# Patient Record
Sex: Male | Born: 1995 | Race: Black or African American | Hispanic: No | Marital: Single | Smoking: Never smoker
Health system: Southern US, Community
[De-identification: ages and names within clinical notes are randomized; demographics above are authoritative.]

## PROBLEM LIST (undated history)

## (undated) DIAGNOSIS — F840 Autistic disorder: Secondary | ICD-10-CM

## (undated) DIAGNOSIS — R569 Unspecified convulsions: Secondary | ICD-10-CM

---
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
## 2018-01-29 ENCOUNTER — Other Ambulatory Visit: Payer: Self-pay

## 2018-01-29 ENCOUNTER — Encounter (HOSPITAL_COMMUNITY): Payer: Self-pay

## 2018-01-29 ENCOUNTER — Emergency Department (HOSPITAL_COMMUNITY)
Admission: EM | Admit: 2018-01-29 | Discharge: 2018-01-29 | Disposition: A | Discharge status: Home / Self Care | Payer: Medicaid Other | Attending: Emergency Medicine | Admitting: Emergency Medicine

## 2018-01-29 DIAGNOSIS — F840 Autistic disorder: Secondary | ICD-10-CM | POA: Insufficient documentation

## 2018-01-29 DIAGNOSIS — Z711 Person with feared health complaint in whom no diagnosis is made: Secondary | ICD-10-CM | POA: Insufficient documentation

## 2018-01-29 DIAGNOSIS — Z041 Encounter for examination and observation following transport accident: Secondary | ICD-10-CM | POA: Diagnosis present

## 2018-01-29 DIAGNOSIS — Z79899 Other long term (current) drug therapy: Secondary | ICD-10-CM | POA: Diagnosis not present

## 2018-01-29 NOTE — ED Notes (Signed)
Pt verbalized understanding of discharge instructions and denies any further questions at this time.   
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
## 2018-01-29 NOTE — ED Notes (Signed)
Pt has autism  Needs to be with dad he does not speak

## 2018-01-29 NOTE — Discharge Instructions (Addendum)
Please read attached information. If you experience any new or worsening signs or symptoms please return to the emergency room for evaluation. Please follow-up with your primary care provider or specialist as discussed.  °
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
## 2019-06-11 ENCOUNTER — Other Ambulatory Visit: Payer: Self-pay

## 2019-06-11 ENCOUNTER — Encounter (HOSPITAL_COMMUNITY): Payer: Self-pay

## 2019-06-11 ENCOUNTER — Emergency Department (HOSPITAL_COMMUNITY)
Admission: EM | Admit: 2019-06-11 | Discharge: 2019-06-11 | Disposition: A | Discharge status: Home / Self Care | Payer: Medicaid Other | Attending: Emergency Medicine | Admitting: Emergency Medicine

## 2019-06-11 DIAGNOSIS — Z79899 Other long term (current) drug therapy: Secondary | ICD-10-CM | POA: Insufficient documentation

## 2019-06-11 DIAGNOSIS — R109 Unspecified abdominal pain: Secondary | ICD-10-CM

## 2019-06-11 DIAGNOSIS — Y999 Unspecified external cause status: Secondary | ICD-10-CM | POA: Insufficient documentation

## 2019-06-11 DIAGNOSIS — M549 Dorsalgia, unspecified: Secondary | ICD-10-CM | POA: Diagnosis not present

## 2019-06-11 DIAGNOSIS — Y9389 Activity, other specified: Secondary | ICD-10-CM | POA: Diagnosis not present

## 2019-06-11 DIAGNOSIS — F840 Autistic disorder: Secondary | ICD-10-CM | POA: Diagnosis not present

## 2019-06-11 DIAGNOSIS — Y92410 Unspecified street and highway as the place of occurrence of the external cause: Secondary | ICD-10-CM | POA: Diagnosis not present
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
## 2019-06-11 NOTE — Discharge Instructions (Signed)
Monitor closely for worsening signs of pain or injury.  If he develops bruising of the stomach, return to the emergency room.  If he starts vomiting or becoming confused, return to the emergency room. Follow-up with his primary care doctor in 1 week as needed for reevaluation. Return with any new, worsening, concerning symptoms.
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
## 2019-10-03 ENCOUNTER — Ambulatory Visit: Payer: Medicaid Other

## 2020-03-11 ENCOUNTER — Other Ambulatory Visit: Payer: Medicaid Other

## 2020-03-11 DIAGNOSIS — Z20822 Contact with and (suspected) exposure to covid-19: Secondary | ICD-10-CM

## 2020-03-12 LAB — NOVEL CORONAVIRUS, NAA: SARS-CoV-2, NAA: NOT DETECTED

## 2020-03-12 LAB — SARS-COV-2, NAA 2 DAY TAT

## 2020-11-27 ENCOUNTER — Emergency Department (HOSPITAL_COMMUNITY): Payer: Medicaid Other

## 2020-11-27 ENCOUNTER — Encounter (HOSPITAL_COMMUNITY): Payer: Self-pay | Admitting: Emergency Medicine

## 2020-11-27 ENCOUNTER — Other Ambulatory Visit (HOSPITAL_COMMUNITY): Payer: Medicaid Other

## 2020-11-27 ENCOUNTER — Inpatient Hospital Stay (HOSPITAL_COMMUNITY)
Admission: EM | Admit: 2020-11-27 | Discharge: 2020-11-29 | DRG: 563 | Disposition: A | Discharge status: Home / Self Care | Payer: Medicaid Other | Attending: Internal Medicine | Admitting: Internal Medicine

## 2020-11-27 ENCOUNTER — Other Ambulatory Visit: Payer: Self-pay

## 2020-11-27 DIAGNOSIS — F840 Autistic disorder: Secondary | ICD-10-CM | POA: Diagnosis present

## 2020-11-27 DIAGNOSIS — M25562 Pain in left knee: Secondary | ICD-10-CM

## 2020-11-27 DIAGNOSIS — F39 Unspecified mood [affective] disorder: Secondary | ICD-10-CM | POA: Diagnosis present

## 2020-11-27 DIAGNOSIS — Z79899 Other long term (current) drug therapy: Secondary | ICD-10-CM

## 2020-11-27 DIAGNOSIS — E669 Obesity, unspecified: Secondary | ICD-10-CM | POA: Diagnosis present

## 2020-11-27 DIAGNOSIS — R6250 Unspecified lack of expected normal physiological development in childhood: Secondary | ICD-10-CM | POA: Diagnosis present

## 2020-11-27 DIAGNOSIS — G40909 Epilepsy, unspecified, not intractable, without status epilepticus: Secondary | ICD-10-CM | POA: Diagnosis present

## 2020-11-27 DIAGNOSIS — Z20822 Contact with and (suspected) exposure to covid-19: Secondary | ICD-10-CM | POA: Diagnosis present

## 2020-11-27 DIAGNOSIS — S83005A Unspecified dislocation of left patella, initial encounter: Principal | ICD-10-CM | POA: Diagnosis present

## 2020-11-27 DIAGNOSIS — Z6839 Body mass index (BMI) 39.0-39.9, adult: Secondary | ICD-10-CM

## 2020-11-27 DIAGNOSIS — X58XXXA Exposure to other specified factors, initial encounter: Secondary | ICD-10-CM | POA: Diagnosis present

## 2020-11-27 DIAGNOSIS — K219 Gastro-esophageal reflux disease without esophagitis: Secondary | ICD-10-CM | POA: Diagnosis present

## 2020-11-27 DIAGNOSIS — S83006A Unspecified dislocation of unspecified patella, initial encounter: Secondary | ICD-10-CM | POA: Diagnosis present

## 2020-11-27 LAB — COMPREHENSIVE METABOLIC PANEL
ALT: 19 U/L (ref 0–44)
AST: 31 U/L (ref 15–41)
Albumin: 4.4 g/dL (ref 3.5–5.0)
Alkaline Phosphatase: 60 U/L (ref 38–126)
Anion gap: 8 (ref 5–15)
BUN: 13 mg/dL (ref 6–20)
CO2: 28 mmol/L (ref 22–32)
Calcium: 9.4 mg/dL (ref 8.9–10.3)
Chloride: 103 mmol/L (ref 98–111)
Creatinine, Ser: 1.21 mg/dL (ref 0.61–1.24)
GFR, Estimated: >60 mL/min (ref >60)
Glucose, Bld: 101 mg/dL — ABNORMAL HIGH (ref 70–99)
Potassium: 4 mmol/L (ref 3.5–5.1)
Sodium: 139 mmol/L (ref 135–145)
Total Bilirubin: 0.9 mg/dL (ref 0.3–1.2)
Total Protein: 8.1 g/dL (ref 6.5–8.1)

## 2020-11-27 LAB — CBC
HCT: 45.6 % (ref 39.0–52.0)
Hemoglobin: 14.1 g/dL (ref 13.0–17.0)
MCH: 28 pg (ref 26.0–34.0)
MCHC: 30.9 g/dL (ref 30.0–36.0)
MCV: 90.5 fL (ref 80.0–100.0)
Platelets: 280 10*3/uL (ref 150–400)
RBC: 5.04 MIL/uL (ref 4.22–5.81)
RDW: 11.8 % (ref 11.5–15.5)
WBC: 10.2 10*3/uL (ref 4.0–10.5)
nRBC: 0 % (ref 0.0–0.2)

## 2020-11-27 LAB — MAGNESIUM: Magnesium: 2.3 mg/dL (ref 1.7–2.4)

## 2020-11-27 MED ORDER — HYDROMORPHONE HCL 1 MG/ML IJ SOLN
1.0000 mg | Freq: Once | INTRAMUSCULAR | Status: AC
  Administered 2020-11-27: 1 mg via INTRAVENOUS
  Filled 2020-11-27: qty 1

## 2020-11-27 NOTE — ED Notes (Signed)
Pt father reports they have known of patient having seizure-like activity x1year. He also reports he had seizure-like episode last week. He is not on seizure medications.

## 2020-11-27 NOTE — ED Notes (Signed)
Pt placed on 2L nasal cannula for support. Pt O2 saturated fell to 89% with good pleth while pt is asleep.
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
## 2020-11-28 ENCOUNTER — Inpatient Hospital Stay (HOSPITAL_COMMUNITY): Payer: Medicaid Other

## 2020-11-28 ENCOUNTER — Inpatient Hospital Stay (HOSPITAL_COMMUNITY)
Admit: 2020-11-28 | Discharge: 2020-11-28 | Disposition: A | Discharge status: Home / Self Care | Payer: Medicaid Other | Attending: Internal Medicine | Admitting: Internal Medicine

## 2020-11-28 ENCOUNTER — Emergency Department (HOSPITAL_COMMUNITY): Payer: Medicaid Other

## 2020-11-28 ENCOUNTER — Encounter (HOSPITAL_COMMUNITY): Payer: Self-pay

## 2020-11-28 DIAGNOSIS — R569 Unspecified convulsions: Secondary | ICD-10-CM

## 2020-11-28 DIAGNOSIS — F39 Unspecified mood [affective] disorder: Secondary | ICD-10-CM | POA: Diagnosis present

## 2020-11-28 DIAGNOSIS — X58XXXA Exposure to other specified factors, initial encounter: Secondary | ICD-10-CM | POA: Diagnosis present

## 2020-11-28 DIAGNOSIS — R6250 Unspecified lack of expected normal physiological development in childhood: Secondary | ICD-10-CM | POA: Diagnosis present

## 2020-11-28 DIAGNOSIS — S83006A Unspecified dislocation of unspecified patella, initial encounter: Secondary | ICD-10-CM

## 2020-11-28 DIAGNOSIS — K219 Gastro-esophageal reflux disease without esophagitis: Secondary | ICD-10-CM | POA: Diagnosis present

## 2020-11-28 DIAGNOSIS — S83005A Unspecified dislocation of left patella, initial encounter: Secondary | ICD-10-CM | POA: Diagnosis present

## 2020-11-28 DIAGNOSIS — Z79899 Other long term (current) drug therapy: Secondary | ICD-10-CM | POA: Diagnosis not present

## 2020-11-28 DIAGNOSIS — Z20822 Contact with and (suspected) exposure to covid-19: Secondary | ICD-10-CM | POA: Diagnosis present

## 2020-11-28 DIAGNOSIS — Z6839 Body mass index (BMI) 39.0-39.9, adult: Secondary | ICD-10-CM | POA: Diagnosis not present

## 2020-11-28 DIAGNOSIS — E669 Obesity, unspecified: Secondary | ICD-10-CM | POA: Diagnosis present

## 2020-11-28 DIAGNOSIS — F840 Autistic disorder: Secondary | ICD-10-CM | POA: Diagnosis present

## 2020-11-28 DIAGNOSIS — M25562 Pain in left knee: Secondary | ICD-10-CM | POA: Diagnosis present

## 2020-11-28 DIAGNOSIS — G40909 Epilepsy, unspecified, not intractable, without status epilepticus: Secondary | ICD-10-CM | POA: Diagnosis present

## 2020-11-28 LAB — COMPREHENSIVE METABOLIC PANEL
ALT: 17 U/L (ref 0–44)
AST: 29 U/L (ref 15–41)
Albumin: 4.2 g/dL (ref 3.5–5.0)
Alkaline Phosphatase: 55 U/L (ref 38–126)
Anion gap: 5 (ref 5–15)
BUN: 10 mg/dL (ref 6–20)
CO2: 27 mmol/L (ref 22–32)
Calcium: 8.9 mg/dL (ref 8.9–10.3)
Chloride: 104 mmol/L (ref 98–111)
Creatinine, Ser: 0.87 mg/dL (ref 0.61–1.24)
GFR, Estimated: >60 mL/min (ref >60)
Glucose, Bld: 104 mg/dL — ABNORMAL HIGH (ref 70–99)
Potassium: 4.2 mmol/L (ref 3.5–5.1)
Sodium: 136 mmol/L (ref 135–145)
Total Bilirubin: 1.3 mg/dL — ABNORMAL HIGH (ref 0.3–1.2)
Total Protein: 7.7 g/dL (ref 6.5–8.1)

## 2020-11-28 LAB — CBC
HCT: 43.8 % (ref 39.0–52.0)
Hemoglobin: 13.6 g/dL (ref 13.0–17.0)
MCH: 27.8 pg (ref 26.0–34.0)
MCHC: 31.1 g/dL (ref 30.0–36.0)
MCV: 89.6 fL (ref 80.0–100.0)
Platelets: 275 10*3/uL (ref 150–400)
RBC: 4.89 MIL/uL (ref 4.22–5.81)
RDW: 11.9 % (ref 11.5–15.5)
WBC: 9.6 10*3/uL (ref 4.0–10.5)
nRBC: 0 % (ref 0.0–0.2)

## 2020-11-28 LAB — RESP PANEL BY RT-PCR (FLU A&B, COVID) ARPGX2
Influenza A by PCR: NEGATIVE
Influenza B by PCR: NEGATIVE
SARS Coronavirus 2 by RT PCR: NEGATIVE

## 2020-11-28 LAB — PROTIME-INR
INR: 1 (ref 0.8–1.2)
Prothrombin Time: 13.7 seconds (ref 11.4–15.2)

## 2020-11-28 LAB — SARS CORONAVIRUS 2 (TAT 6-24 HRS): SARS Coronavirus 2: NEGATIVE

## 2020-11-28 LAB — HIV ANTIBODY (ROUTINE TESTING W REFLEX): HIV Screen 4th Generation wRfx: NONREACTIVE

## 2020-11-28 LAB — MAGNESIUM: Magnesium: 2.3 mg/dL (ref 1.7–2.4)

## 2020-11-28 MED ORDER — ARIPIPRAZOLE 5 MG PO TABS
15.0000 mg | ORAL_TABLET | Freq: Every day | ORAL | Status: DC
  Administered 2020-11-28 – 2020-11-29 (×2): 15 mg via ORAL
  Filled 2020-11-28 (×2): qty 1

## 2020-11-28 MED ORDER — ACETAMINOPHEN 325 MG PO TABS
650.0000 mg | ORAL_TABLET | Freq: Four times a day (QID) | ORAL | Status: DC | PRN
Start: 2020-11-28 — End: 2020-11-29

## 2020-11-28 MED ORDER — FLUOXETINE HCL 10 MG PO CAPS
10.0000 mg | ORAL_CAPSULE | Freq: Every day | ORAL | Status: DC
  Administered 2020-11-28 – 2020-11-29 (×2): 10 mg via ORAL
  Filled 2020-11-28 (×2): qty 1

## 2020-11-28 MED ORDER — ONDANSETRON HCL 4 MG/2ML IJ SOLN: 4.0000 mg | Freq: Four times a day (QID) | INTRAMUSCULAR | Status: DC | PRN

## 2020-11-28 MED ORDER — ONDANSETRON HCL 4 MG PO TABS
4.0000 mg | ORAL_TABLET | Freq: Four times a day (QID) | ORAL | Status: DC | PRN
Start: 2020-11-28 — End: 2020-11-29

## 2020-11-28 MED ORDER — LORAZEPAM 2 MG/ML IJ SOLN
2.0000 mg | INTRAMUSCULAR | Status: DC | PRN
  Administered 2020-11-28: 2 mg via INTRAVENOUS
  Filled 2020-11-28: qty 1

## 2020-11-28 MED ORDER — IOHEXOL 300 MG/ML  SOLN
100.0000 mL | Freq: Once | INTRAMUSCULAR | Status: AC | PRN
  Administered 2020-11-28: 100 mL via INTRAVENOUS

## 2020-11-28 MED ORDER — ENOXAPARIN SODIUM 40 MG/0.4ML IJ SOSY
40.0000 mg | PREFILLED_SYRINGE | INTRAMUSCULAR | Status: DC
  Administered 2020-11-28 – 2020-11-29 (×2): 40 mg via SUBCUTANEOUS
  Filled 2020-11-28 (×2): qty 0.4

## 2020-11-28 MED ORDER — QUETIAPINE FUMARATE 50 MG PO TABS
300.0000 mg | ORAL_TABLET | Freq: Every day | ORAL | Status: DC
  Administered 2020-11-28: 300 mg via ORAL
  Filled 2020-11-28: qty 6

## 2020-11-28 MED ORDER — MORPHINE SULFATE (PF) 2 MG/ML IV SOLN
2.0000 mg | INTRAVENOUS | Status: DC | PRN
  Administered 2020-11-28: 2 mg via INTRAVENOUS
  Filled 2020-11-28: qty 1

## 2020-11-28 MED ORDER — DIVALPROEX SODIUM 125 MG PO CSDR
500.0000 mg | DELAYED_RELEASE_CAPSULE | Freq: Two times a day (BID) | ORAL | Status: DC
  Administered 2020-11-28 – 2020-11-29 (×2): 500 mg via ORAL
  Filled 2020-11-28 (×2): qty 4

## 2020-11-28 MED ORDER — ACETAMINOPHEN 650 MG RE SUPP: 650.0000 mg | Freq: Four times a day (QID) | RECTAL | Status: DC | PRN

## 2020-11-28 MED ORDER — LORAZEPAM 1 MG PO TABS: 1.0000 mg | ORAL_TABLET | Freq: Every day | ORAL | Status: DC | PRN

## 2020-11-28 MED ORDER — SODIUM CHLORIDE (PF) 0.9 % IJ SOLN
INTRAMUSCULAR | Status: AC
  Filled 2020-11-28: qty 50

## 2020-11-28 MED ORDER — FAMOTIDINE 20 MG PO TABS: 20.0000 mg | ORAL_TABLET | Freq: Every day | ORAL | Status: DC

## 2020-11-28 MED ORDER — OXYCODONE HCL 5 MG PO TABS
5.0000 mg | ORAL_TABLET | ORAL | Status: DC | PRN
  Administered 2020-11-28 – 2020-11-29 (×3): 5 mg via ORAL
  Filled 2020-11-28 (×3): qty 1

## 2020-11-28 MED ORDER — FAMOTIDINE 20 MG PO TABS
20.0000 mg | ORAL_TABLET | Freq: Every day | ORAL | Status: DC
  Administered 2020-11-28: 20 mg via ORAL
  Filled 2020-11-28: qty 1

## 2020-11-28 NOTE — H&P (Signed)
TRH H&P    Patient Demographics:    Carl Rodriguez, is a 25 y.o. male  MRN: [id number]  DOB - September 22, 1995  Admit Date - 11/27/2020  Referring MD/NP/PA: Bebe Shaggy  Outpatient Primary MD for the patient is Kathryne Sharper [facility]  Patient coming from: Group home  Chief complaint- knee pain   HPI:    Carl Rodriguez  is a 25 y.o. male, with history of autism and seizures presents to the ED with a chief complaint of knee pain.  Unfortunately due to his developmental delay he is not able to provide any history.  This could be partially due to medications he was given in the ED.  When I ask a question he just repeats the last few words of what ever question I asked him.  Per chart review -patient has a history of seizures but is not currently on any seizure medications.  He had a seizure today that was not witnessed.  Family heard him fall and he blocked the door of the room he was and upon falling.  Seizure lasted for less than 10 minutes.  He was postictal.  He was found on the floor with his leg twisted after the seizure had resolved.  So they brought him into the ED.  His last seizure was about 1 week ago.  1 month ago he had Cogentin discontinued.  Patient lives in a group home owned by his father.   In the ED Temp 97.9, heart rate 96, respiratory rate 17, blood pressure 145/99, satting at 98% on 2 L X-ray of the knee showed a Lateral patellar dislocation and questionable bone fragment advising full knee series. Full knee series still had some questionable finding so advised CT of the knee CT of the knee showed lateral patellar dislocation with likely avulsion of the medial patellar retinaculum and partial avulsion of the inferior pole insertion of the patellar tendon.  Displaced fracture fragment along the lateral femoral trochlea indeterminate donor site.  Fracture along the medial trochlear  ridge/apex.  Avulsion of the femoral footprint of the lateral collateral ligament partially retracted tendon.  Large lipohemarthrosis and suspected communication between the joint space and the patellar bursa with some foci of gas.  ACL is incompletely assessed.  Bowing of the iliotibial band and large effusion of the lateral knee recess. Ortho was consulted and advised admission because patient would have difficulty ambulating, and arranging follow-up given his cognitive delay and social situation with per ED provider, orthopedics plans to see patient today Patient left knee in the immobilizer    Review of systems:  Unfortunately review of systems could not be obtained due to patient's cognitive delay   Past History of the following :    Past Medical History:  Diagnosis Date   Autism       History reviewed. No pertinent surgical history.    Social History:      Social History   Tobacco Use   Smoking status: Never    Passive exposure: Never   Smokeless tobacco: Never  Substance  Use Topics   Alcohol use: No       Family History :     Family History  Problem Relation Age of Onset   Hypertension Father    Hyperlipidemia Father       Home Medications:   Prior to Admission medications   Medication Sig Start Date End Date Taking? Authorizing Provider  ARIPiprazole (ABILIFY) 15 MG tablet Take 15 mg by mouth daily. 11/19/20  Yes [provider]  cetirizine (ZYRTEC) 10 MG tablet Take 10 mg by mouth daily. 11/19/20  Yes [provider]  diphenhydrAMINE (BENADRYL) 50 MG capsule Take 50 mg by mouth every 6 (six) hours as needed for allergies.   Yes [provider]  famotidine (PEPCID) 20 MG tablet Take 20 mg by mouth daily. 11/19/20  Yes [provider]  FLUoxetine (PROZAC) 20 MG/5ML solution Take 10 mg by mouth daily. 11/23/20  Yes [provider]  LORazepam (ATIVAN) 1 MG tablet Take 1 mg by mouth daily as needed for anxiety. 10/31/20  Yes  [provider]  QUEtiapine (SEROQUEL) 300 MG tablet Take 300 mg by mouth at bedtime. 11/19/20  Yes [provider]  ibuprofen (CHILDRENS IBUPROFEN) 100 MG/5ML suspension Take 30 mLs (600 mg total) by mouth every 6 (six) hours as needed for moderate pain. Patient not taking: No sig reported 04/14/14   Keith Rake, MD     Allergies:    No Known Allergies   Physical Exam:   Vitals  Blood pressure (!) 145/99, pulse 96, temperature 97.9 F (36.6 C), temperature source Oral, resp. rate 17, SpO2 98 %.  1.  General: Patient lying supine in bed,  no acute distress   2. Psychiatric: Alert and oriented x 3, alert makes good eye contact, cooperative with exam, not currently answering questions-only repeating the last few words of questions   3. Neurologic: Speech speech is normal, face is symmetric, moves all 4 extremities voluntarily, at baseline without acute deficits on limited exam   4. HEENMT:  Head is atraumatic, normocephalic, pupils reactive to light, neck is supple, trachea is midline, mucous membranes are moist   5. Respiratory : Lungs are clear to auscultation bilaterally without wheezing, rhonchi, rales, no cyanosis, no increase in work of breathing or accessory muscle use   6. Cardiovascular : Heart rate normal, rhythm is regular, systolic murmur present, no rubs or gallops, no peripheral edema, peripheral pulses palpated   7. Gastrointestinal:  Abdomen is soft, nondistended, nontender to palpation bowel sounds active, no masses or organomegaly palpated   8. Skin:  Skin is warm, dry and intact without rashes, acute lesions, or ulcers on limited exam   9.Musculoskeletal:  Left knee immobilizer in place, peripheral pulses palpated, entire leg is tender to palpation, immobilizer not removed to examine the area, right leg without acute findings    Data Review:    CBC Recent Labs  Lab 11/27/20 2113  WBC 10.2  HGB 14.1  HCT 45.6  PLT 280  MCV 90.5   MCH 28.0  MCHC 30.9  RDW 11.8   ------------------------------------------------------------------------------------------------------------------  Results for orders placed or performed during the hospital encounter of 11/27/20 (from the past 48 hour(s))  Comprehensive metabolic panel     Status: Abnormal   Collection Time: 11/27/20  9:13 PM  Result Value Ref Range   Sodium 139 135 - 145 mmol/L   Potassium 4.0 3.5 - 5.1 mmol/L   Chloride 103 98 - 111 mmol/L   CO2 28 22 - 32  mmol/L   Glucose, Bld 101 (H) 70 - 99 mg/dL    Comment: Glucose reference range applies only to samples taken after fasting for at least 8 hours.   BUN 13 6 - 20 mg/dL   Creatinine, Ser 1.61 0.61 - 1.24 mg/dL   Calcium 9.4 8.9 - 09.6 mg/dL   Total Protein 8.1 6.5 - 8.1 g/dL   Albumin 4.4 3.5 - 5.0 g/dL   AST 31 15 - 41 U/L   ALT 19 0 - 44 U/L   Alkaline Phosphatase 60 38 - 126 U/L   Total Bilirubin 0.9 0.3 - 1.2 mg/dL   GFR, Estimated >04 >54 mL/min    Comment: (NOTE) Calculated using the CKD-EPI Creatinine Equation (2021)    Anion gap 8 5 - 15    Comment: Performed at [facility], [address], [place], [place] [address]  CBC     Status: None   Collection Time: 11/27/20  9:13 PM  Result Value Ref Range   WBC 10.2 4.0 - 10.5 K/uL   RBC 5.04 4.22 - 5.81 MIL/uL   Hemoglobin 14.1 13.0 - 17.0 g/dL   HCT 91.4 78.2 - 95.6 %   MCV 90.5 80.0 - 100.0 fL   MCH 28.0 26.0 - 34.0 pg   MCHC 30.9 30.0 - 36.0 g/dL   RDW 21.3 08.6 - 57.8 %   Platelets 280 150 - 400 K/uL   nRBC 0.0 0.0 - 0.2 %    Comment: Performed at [facility], [address] [place], [place] [address]  Magnesium     Status: None   Collection Time: 11/27/20  9:13 PM  Result Value Ref Range   Magnesium 2.3 1.7 - 2.4 mg/dL    Comment: Performed at [facility], [address] [place], [place] [address]    Chemistries  Recent Labs  Lab 11/27/20 2113  NA 139  K 4.0  CL 103   CO2 28  GLUCOSE 101*  BUN 13  CREATININE 1.21  CALCIUM 9.4  MG 2.3  AST 31  ALT 19  ALKPHOS 60  BILITOT 0.9   ------------------------------------------------------------------------------------------------------------------  ------------------------------------------------------------------------------------------------------------------ GFR: CrCl cannot be calculated (Unknown ideal weight.). Liver Function Tests: Recent Labs  Lab 11/27/20 2113  AST 31  ALT 19  ALKPHOS 60  BILITOT 0.9  PROT 8.1  ALBUMIN 4.4   No results for input(s): LIPASE, AMYLASE in the last 168 hours. No results for input(s): AMMONIA in the last 168 hours. Coagulation Profile: No results for input(s): INR, PROTIME in the last 168 hours. Cardiac Enzymes: No results for input(s): CKTOTAL, CKMB, CKMBINDEX, TROPONINI in the last 168 hours. BNP (last 3 results) No results for input(s): PROBNP in the last 8760 hours. HbA1C: No results for input(s): HGBA1C in the last 72 hours. CBG: No results for input(s): GLUCAP in the last 168 hours. Lipid Profile: No results for input(s): CHOL, HDL, LDLCALC, TRIG, CHOLHDL, LDLDIRECT in the last 72 hours. Thyroid Function Tests: No results for input(s): TSH, T4TOTAL, FREET4, T3FREE, THYROIDAB in the last 72 hours. Anemia Panel: No results for input(s): VITAMINB12, FOLATE, FERRITIN, TIBC, IRON, RETICCTPCT in the last 72 hours.  --------------------------------------------------------------------------------------------------------------- Urine analysis:    Component Value Date/Time   COLORURINE YELLOW 01/16/2010 1952   APPEARANCEUR CLEAR 01/16/2010 1952   LABSPEC 1.033 (H) 01/16/2010 1952   PHURINE 5.5 01/16/2010 1952   GLUCOSEU NEGATIVE 01/16/2010 1952   HGBUR NEGATIVE 01/16/2010 1952   BILIRUBINUR NEGATIVE 01/16/2010 1952   KETONESUR  15 (A) 01/16/2010 1952   PROTEINUR NEGATIVE 01/16/2010 1952   UROBILINOGEN 0.2 01/16/2010 1952   NITRITE NEGATIVE  01/16/2010 1952   LEUKOCYTESUR  01/16/2010 1952    NEGATIVE MICROSCOPIC NOT DONE ON URINES WITH NEGATIVE PROTEIN, BLOOD, LEUKOCYTES, NITRITE, OR GLUCOSE <1000 mg/dL.      Imaging Results:    DG Knee Complete 4 Views Left  Result Date: 11/27/2020 CLINICAL DATA:  Knee injury.  Possible seizure. EXAM: LEFT KNEE - COMPLETE 4+ VIEW COMPARISON:  November 27, 2020 FINDINGS: On the initial view, there appears to be a lateral patellar dislocation, unchanged since earlier tonight. Soft tissue calcifications are seen adjacent to the lateral distal femur, inferior to the patella, unchanged. On the second image, an oblique view, the patella remains dislocated. The calcifications adjacent to the distal aspect of the femur appear to arise from the femur with donor sites identified. Other calcifications are seen inferior to the patella. On a true lateral view, there is a crescent-shaped calcification anterior and inferior to the patella, likely arising from the patella. On the final view, a frontal view, the patella has been relocated. Fracture fragments are seen adjacent to the distal lateral femoral condyle with an apparent donor site in this region. Soft tissue swelling is seen medially. A fracture fragment is seen medially as well, possibly arising from the patella but nonspecific. IMPRESSION: 1. At the beginning of the study, the patella is still laterally dislocated. By the final image, the patella appears to been relocated. Recommend clinical correlation. 2. There is a fracture through the distal lateral femoral condyle with a few adjacent fracture fragments. A crescent shaped soft tissue calcification anterior and inferior to the patella is favored to arise from the patella. 3. A calcification is seen medially on the final view and may represent 1 of the fracture fragments arising from the patella but is nonspecific. 4. A CT or MRI would better evaluate the source of all the fracture fragments. CT would be better at  evaluating bony detail. MRI would be better evaluating tendons and ligaments. Electronically Signed   By: Gerome Sam III M.D   On: 11/27/2020 23:27   DG Knee Left Port  Result Date: 11/27/2020 CLINICAL DATA:  Knee injury, possible seizure EXAM: PORTABLE LEFT KNEE - 1-2 VIEW COMPARISON:  None. FINDINGS: There appears to be lateral dislocation of the patella on this single view limited study. Possible bone fragment adjacent to the lateral femoral condyle. Recommend full knee series when able. IMPRESSION: Lateral patellar dislocation. Question bone fragment adjacent to the lateral femoral condyle. Recommend full knee series when able. Electronically Signed   By: Charlett Nose M.D.   On: 11/27/2020 22:07   CT EXTREMITY LOWER LEFT W CONTRAST  Addendum Date: 11/28/2020   ADDENDUM REPORT: 11/28/2020 02:28 ADDENDUM: Non angiographic imaging technique may detection of subtle vascular injuries. No clear discernible vascular injury to the common, superficial deep femoral arteries popliteal artery at the the the tibioperoneal trunk or vessels in the lower extremity. There is persisting clinical concern, angiography could be obtained. Initial results and addendum were discussed by telephone at the time of interpretation on 11/28/2020 at 2:27 am to provider Zadie Rhine , who verbally acknowledged these results. Electronically Signed   By: Kreg Shropshire M.D.   On: 11/28/2020 02:28   Result Date: 11/28/2020 CLINICAL DATA:  Knee injury during seizure EXAM: CT OF THE LOWER LEFT EXTREMITY WITH CONTRAST TECHNIQUE: Multidetector CT imaging of the lower left extremity was performed according to the  standard protocol following intravenous contrast administration. CONTRAST:  OMNIPAQUE IOHEXOL 300 MG/ML  SOLN COMPARISON:  Radiograph 11/27/2020 FINDINGS: Bones/Joint/Cartilage Lateral patellar dislocation. Displaced fracture fragment along the inferior pole patella involving the footprint of the medial bands of the  patellar tendon (5/237, 238). Displaced fracture fragment along the lateral patellar margin at the attachment of the medial retinaculum (5/227, 238). Minimally displaced cortical fragment along the apex of the medial femoral trochlear ridge (5/243). Displaced fracture fragment of indeterminate origin along the lateral trochlear facet (5/239). Avulsive appearing fracture fragments at the femoral footprint of the lateral collateral ligament (5/242). Layering lipohemarthrosis. Possible communication between knee joint and prepatellar bursa with layering lipohemarthrosis anterior to the patellar tendon and within lateral recess of the knee. Few punctate foci of gas noted as well, could reflect an open injury given overlying skin thickening and possible laceration. Included bones of the pelvis are intact. Alignment of the hip is maintained. Tibia and fibula appear grossly intact with some motion artifact at the level of the distal third diaphysis. Included portions of the ankle are intact and aligned. Ligaments Suboptimally assessed by CT. PCL is intact.  Thickening and irregularity of the ACL. Avulsion of the lateral collateral ligament with partial retraction. Suspect complete disruption of the medial retinaculum. Lateral retinaculum appears to remain in continuity. Muscles and Tendons Partial tearing and retraction along the medial fibers of the patellar tendon with additional thickening and stranding along the remaining bands. Minimal thickening along the distal quadriceps tendon. Bowing of the iliotibial band superficial to the large lateral likely hemarthrosis and partially retracted lateral collateral ligament. No clear intramuscular hematoma. Soft tissues Extensive circumferential soft tissue swelling of the knee. Some mild anterior soft tissue thickening and possible laceration. Lipohemarthrosis as above as well as few foci of soft tissue gas within this collection which could suggest an open injury. No  radiopaque foreign bodies within the soft tissues. Metallic density overlies the soft tissues of the knee. IMPRESSION: 1. Lateral patellar dislocation with likely avulsion of the medial patellar retinaculum and partial avulsion of the inferior pole insertion of the patellar tendon. 2. Displaced fracture fragment along the lateral femoral trochlea, indeterminate donor site. 3. Fracture along the medial trochlear ridge/apex. 4. Avulsion of the femoral footprint of the lateral collateral ligament partially retracted tendon. 5. Large lipohemarthrosis and suspected communication between the joint space and prepatellar bursa. Punctate foci of gas within this lipohemarthrosis could reflect an open injury. 6. Additional irregularity of the ACL, incompletely assessed. 7. Bowing of the iliotibial band with large effusion in the lateral knee recess. Electronically Signed: By: Kreg Shropshire M.D. On: 11/28/2020 01:48      Assessment & Plan:    Active Problems:   Patellar dislocation, initial encounter   Seizure Patient is not on any seizure medications at home Seizure precautions As needed Ativan Patellar dislocation Ortho consulted Pain control N.p.o. pending Ortho evaluation Continue to monitor Mood disorder Continue Abilify, Prozac, Ativan, Seroquel GERD Pepcid given ED Continue Pepcid    DVT Prophylaxis-   Lovenox - SCDs   AM Labs Ordered, also please review Full Orders  Family Communication: No family at bedside  Code Status: Full  Admission status: Inpatient :The appropriate admission status for this patient is INPATIENT. Inpatient status is judged to be reasonable and necessary in order to provide the required intensity of service to ensure the patient's safety. The patient's presenting symptoms, physical exam findings, and initial radiographic and laboratory data in the context of their chronic  comorbidities is felt to place them at high risk for further clinical deterioration.  Furthermore, it is not anticipated that the patient will be medically stable for discharge from the hospital within 2 midnights of admission. The following factors support the admission status of inpatient.     The patient's presenting symptoms include knee injury. The worrisome physical exam findings include patellar dislocation. The initial radiographic and laboratory data are worrisome because of patellar dislocation with tendon injuries and avulsion fractures. The chronic co-morbidities include autism and seizures as well as GERD.       * I certify that at the point of admission it is my clinical judgment that the patient will require inpatient hospital care spanning beyond 2 midnights from the point of admission due to high intensity of service, high risk for further deterioration and high frequency of surveillance required.*  Time spent in minutes : 65   Gavino Fouch B Zierle-Ghosh DO

## 2020-11-28 NOTE — Progress Notes (Signed)
Orthopedic Tech Progress Note Patient Details:  Carl Rodriguez 04-08-1996 [id number]  Ortho Devices Type of Ortho Device: Knee Immobilizer Ortho Device/Splint Location: lle Ortho Device/Splint Interventions: Ordered, Application, Adjustment   Post Interventions Patient Tolerated: Well Instructions Provided: Care of device, Adjustment of device  [name] 11/28/2020, 2:10 AM

## 2020-11-28 NOTE — ED Notes (Addendum)
Received call from ortho tech inquiring about why he was called to place knee immobilizer. I advised that the pt has a fracture and also had a dislocation and this was probably why the RN called the ortho tech for placement of knee immobilizer.  Ortho tech stated it would be a while before he could come over from [facility]. Primary RN and Consulting civil engineer notified of same.

## 2020-11-28 NOTE — Progress Notes (Signed)
EEG Completed; Results Pending  

## 2020-11-28 NOTE — ED Provider Notes (Signed)
Discussed with Dr. Eulah Pont with orthopedics.  Will get CT imaging, plan for knee immobilizer, crutches and call for follow-up this week.  At previous provider's request, will proceed with CT angio of the leg to evaluate for any vascular injury as given his history is difficult to determine exact injury/dislocation   Zadie Rhine, MD 11/28/20 [phone]

## 2020-11-28 NOTE — Procedures (Addendum)
Patient Name: Carl Rodriguez  MRN: [id number]  Epilepsy Attending: Charlsie Quest  Referring Physician/Provider: Dr Osvaldo Shipper Date: 11/28/2020 Duration: 23.20 minutes  Patient history: 25 year old male with history of autism with episodes of jerking and alteration of awareness.  EEG seizures.  Level of alertness: Awake  AEDs during EEG study: None  Technical aspects: This EEG study was done with scalp electrodes positioned according to the 10-20 International system of electrode placement. Electrical activity was acquired at a sampling rate of 500Hz  and reviewed with a high frequency filter of 70Hz  and a low frequency filter of 1Hz . EEG data were recorded continuously and digitally stored.   Description: The posterior dominant rhythm consists of 9 Hz activity of moderate voltage (25-35 uV) seen predominantly in posterior head regions, symmetric and reactive to eye opening and eye closing.  Intermittent generalized 3 to 5 Hz theta-delta slowing was also noted physiologic photic driving was seen during photic stimulation.  Hyperventilation was not performed.     ABNORMALITY -Intermittent slow, generalized  IMPRESSION: This study is suggestive of mild diffuse encephalopathy, nonspecific etiology but could be secondary to patient's history of autism. No seizures or epileptiform discharges were seen throughout the recording.  [place] 

## 2020-11-28 NOTE — ED Provider Notes (Signed)
Discussed CT imaging with Dr. Elvera Maria the radiologist. Although this was not an angio study, there is no obvious vascular injury.  Patient has distal pulses intact.  When I reexamined the patient, there are no lacerations noted around the knee.  He has significant tenderness to palpation of the left knee.  Discussed with Dr. Eulah Pont about the CT findings.  He has  reviewed the imaging. Given patient's underlying health status, it is  likely best to admit him for pain management, likely operative management in the next 48 hours. Patient may need to undergo MRI while in hospital.  Patient's father was updated on plan via phone. D/w Dr Dorthula Perfect for admission    Zadie Rhine, MD 11/28/20 [phone]

## 2020-11-28 NOTE — Progress Notes (Addendum)
[facility] PROGRESS NOTE   FORDYCE LEPAK NLZ:[id number] DOB: 1995/10/08 DOA: 11/27/2020  PCP: [facility]  Brief History/Interval Summary: 25 y.o. male, with history of autism and questionable seizures presented to the ED with a chief complaint of knee pain.  Patient has developmental disability and so unable to provide much information.  According to reports from family there was concern that he had a seizure activity which was not witnessed.  Apparently has had these shaking episodes for a while now.  About a month ago his Cogentin was discontinued.  Patient seems is followed by neurology and by his pediatrician. He was found to have a injuries to his left knee which is thought to require surgical repair.  He was brought in for further management.    Consultants: Orthopedics  Procedures: None yet.  EEG will be ordered  Antibiotics: Anti-infectives (From admission, onward)    None       Subjective/Interval History: Patient noted to be awake.  Does not really communicate much due to his underlying developmental disability.  Does not appear to be in any discomfort.     Assessment/Plan:  Questionable seizure episode Based on chart review it appears the patient has had jerking movements for a while.  It appears that he has been seen by neurology with the [facility].  Last seen by them in February 2021.  Epileptics.  Based on H&P it looks like patient's Cogentin was discontinued about a month ago.  Patient had EEG back in February 2021 which did not show any epileptiform activity. No imaging study of the brain has been done yet.   We will proceed with EEG.     Obtain CT head.  ADDENDUM CT head was without acute findings. Discussed with patient's father. Apparently patient has been experiencing these episodes every 1-2 weeks. Also describes what sounds like post-ictal state after the episode of shaking subsides. EEG is pending.  Discussed the case with Dr. Melynda Ripple who agrees with EEG. Due to behavioral issues patient may not cooperate with LTM. Depakote could be a reasonable option. Father ok with starting this medicine.   Injury to the left knee CT scan of the left lower extremity showed multiple fractures and dislocation of the patella.  Discussed with orthopedics who will evaluate the patient in the hospital.  Will likely need surgical repair.  History of autism/mood disorder Noted to be on Abilify Prozac Ativan and Seroquel which is being continued.    History of GERD Continue Pepcid  Obesity Estimated body mass index is 39.16 kg/m as calculated from the following:   Height as of 06/11/19: 5\' 7"  (1.702 m).   Weight as of 06/11/19: 113.4 kg.    DVT Prophylaxis: Lovenox Code Status: Full code Family Communication: No family at bedside this morning Disposition Plan: Unclear for now  Status is: Inpatient  Remains inpatient appropriate because:Ongoing active pain requiring inpatient pain management, Altered mental status, and IV treatments appropriate due to intensity of illness or inability to take PO  Dispo: The patient is from: Group home              Anticipated d/c is to:  To be determined              Patient currently is not medically stable to d/c.   Difficult to place patient No         Medications: Scheduled:  ARIPiprazole  15 mg Oral Daily   enoxaparin (LOVENOX) injection  40 mg Subcutaneous Q24H   famotidine  20 mg Oral QHS   FLUoxetine  10 mg Oral Daily   QUEtiapine  300 mg Oral QHS   sodium chloride (PF)       Continuous: WUJ:[id number]:acetaminophen **OR** acetaminophen, LORazepam, LORazepam, morphine injection, ondansetron **OR** ondansetron (ZOFRAN) IV, oxyCODONE   Objective:  Vital Signs  Vitals:   11/28/20 0045 11/28/20 0303 11/28/20 0700 11/28/20 0753  BP: (!) 150/72 (!) 145/99 (!) 176/90 (!) 146/97  Pulse: 94 96 (!) 114 (!) 106  Resp:  17 18 18   Temp:    98.5 F (36.9 C)   TempSrc:    Oral  SpO2: 96% 98% 99% 97%   No intake or output data in the 24 hours ending 11/28/20 0902 There were no vitals filed for this visit.  General appearance: Awake alert.  In no distress.  Distracted Resp: Clear to auscultation bilaterally.  Normal effort Cardio: S1-S2 is normal regular.  No S3-S4.  No rubs murmurs or bruit GI: Abdomen is soft.  Nontender nondistended.  Bowel sounds are present normal.  No masses organomegaly Extremities: Left lower extremity is in immobilizer Neurologic:  No focal neurological deficits.    Lab Results:  Data Reviewed: I have personally reviewed following labs and imaging studies  CBC: Recent Labs  Lab 11/27/20 2113 11/28/20 0534  WBC 10.2 9.6  HGB 14.1 13.6  HCT 45.6 43.8  MCV 90.5 89.6  PLT 280 275    Basic Metabolic Panel: Recent Labs  Lab 11/27/20 2113 11/28/20 0534  NA 139 136  K 4.0 4.2  CL 103 104  CO2 28 27  GLUCOSE 101* 104*  BUN 13 10  CREATININE 1.21 0.87  CALCIUM 9.4 8.9  MG 2.3 2.3    GFR: CrCl cannot be calculated (Unknown ideal weight.).  Liver Function Tests: Recent Labs  Lab 11/27/20 2113 11/28/20 0534  AST 31 29  ALT 19 17  ALKPHOS 60 55  BILITOT 0.9 1.3*  PROT 8.1 7.7  ALBUMIN 4.4 4.2      Coagulation Profile: Recent Labs  Lab 11/28/20 0534  INR 1.0      Recent Results (from the past 240 hour(s))  Resp Panel by RT-PCR (Flu A&B, Covid) Nasopharyngeal Swab     Status: None   Collection Time: 11/28/20  5:32 AM   Specimen: Nasopharyngeal Swab; Nasopharyngeal(NP) swabs in vial transport medium  Result Value Ref Range Status   SARS Coronavirus 2 by RT PCR NEGATIVE NEGATIVE Final    Comment: (NOTE) SARS-CoV-2 target nucleic acids are NOT DETECTED.  The SARS-CoV-2 RNA is generally detectable in upper respiratory specimens during the acute phase of infection. The lowest concentration of SARS-CoV-2 viral copies this assay can detect is 138 copies/mL. A negative result does not  preclude SARS-Cov-2 infection and should not be used as the sole basis for treatment or other patient management decisions. A negative result may occur with  improper specimen collection/handling, submission of specimen other than nasopharyngeal swab, presence of viral mutation(s) within the areas targeted by this assay, and inadequate number of viral copies(<138 copies/mL). A negative result must be combined with clinical observations, patient history, and epidemiological information. The expected result is Negative.  Fact Sheet for Patients:  BloggerCourse.comhttps://www.fda.gov/media/152166/download  Fact Sheet for Healthcare Providers:  SeriousBroker.ithttps://www.fda.gov/media/152162/download  This test is no t yet approved or cleared by the [place] FDA and  has been authorized for detection and/or diagnosis of SARS-CoV-2 by FDA under an Emergency Use Authorization (EUA). This EUA will  remain  in effect (meaning this test can be used) for the duration of the COVID-19 declaration under Section 564(b)(1) of the Act, 21 U.S.C.section 360bbb-3(b)(1), unless the authorization is terminated  or revoked sooner.       Influenza A by PCR NEGATIVE NEGATIVE Final   Influenza B by PCR NEGATIVE NEGATIVE Final    Comment: (NOTE) The Xpert Xpress SARS-CoV-2/FLU/RSV plus assay is intended as an aid in the diagnosis of influenza from Nasopharyngeal swab specimens and should not be used as a sole basis for treatment. Nasal washings and aspirates are unacceptable for Xpert Xpress SARS-CoV-2/FLU/RSV testing.  Fact Sheet for Patients: BloggerCourse.com  Fact Sheet for Healthcare Providers: SeriousBroker.it  This test is not yet approved or cleared by the [place] FDA and has been authorized for detection and/or diagnosis of SARS-CoV-2 by FDA under an Emergency Use Authorization (EUA). This EUA will remain in effect (meaning this test can be used) for the  duration of the COVID-19 declaration under Section 564(b)(1) of the Act, 21 U.S.C. section 360bbb-3(b)(1), unless the authorization is terminated or revoked.  Performed at [facility], [address] [place], [place] [address]       Radiology Studies: DG Knee Complete 4 Views Left  Result Date: 11/27/2020 CLINICAL DATA:  Knee injury.  Possible seizure. EXAM: LEFT KNEE - COMPLETE 4+ VIEW COMPARISON:  November 27, 2020 FINDINGS: On the initial view, there appears to be a lateral patellar dislocation, unchanged since earlier tonight. Soft tissue calcifications are seen adjacent to the lateral distal femur, inferior to the patella, unchanged. On the second image, an oblique view, the patella remains dislocated. The calcifications adjacent to the distal aspect of the femur appear to arise from the femur with donor sites identified. Other calcifications are seen inferior to the patella. On a true lateral view, there is a crescent-shaped calcification anterior and inferior to the patella, likely arising from the patella. On the final view, a frontal view, the patella has been relocated. Fracture fragments are seen adjacent to the distal lateral femoral condyle with an apparent donor site in this region. Soft tissue swelling is seen medially. A fracture fragment is seen medially as well, possibly arising from the patella but nonspecific. IMPRESSION: 1. At the beginning of the study, the patella is still laterally dislocated. By the final image, the patella appears to been relocated. Recommend clinical correlation. 2. There is a fracture through the distal lateral femoral condyle with a few adjacent fracture fragments. A crescent shaped soft tissue calcification anterior and inferior to the patella is favored to arise from the patella. 3. A calcification is seen medially on the final view and may represent 1 of the fracture fragments arising from the patella but is nonspecific. 4. A CT or MRI  would better evaluate the source of all the fracture fragments. CT would be better at evaluating bony detail. MRI would be better evaluating tendons and ligaments. Electronically Signed   By: Gerome Sam III M.D   On: 11/27/2020 23:27   DG Knee Left Port  Result Date: 11/27/2020 CLINICAL DATA:  Knee injury, possible seizure EXAM: PORTABLE LEFT KNEE - 1-2 VIEW COMPARISON:  None. FINDINGS: There appears to be lateral dislocation of the patella on this single view limited study. Possible bone fragment adjacent to the lateral femoral condyle. Recommend full knee series when able. IMPRESSION: Lateral patellar dislocation. Question bone fragment adjacent to the lateral femoral condyle. Recommend full knee series when able. Electronically Signed   By:  Charlett Nose M.D.   On: 11/27/2020 22:07   CT EXTREMITY LOWER LEFT W CONTRAST  Addendum Date: 11/28/2020   ADDENDUM REPORT: 11/28/2020 02:28 ADDENDUM: Non angiographic imaging technique may detection of subtle vascular injuries. No clear discernible vascular injury to the common, superficial deep femoral arteries popliteal artery at the the the tibioperoneal trunk or vessels in the lower extremity. There is persisting clinical concern, angiography could be obtained. Initial results and addendum were discussed by telephone at the time of interpretation on 11/28/2020 at 2:27 am to provider Zadie Rhine , who verbally acknowledged these results. Electronically Signed   By: Kreg Shropshire M.D.   On: 11/28/2020 02:28   Result Date: 11/28/2020 CLINICAL DATA:  Knee injury during seizure EXAM: CT OF THE LOWER LEFT EXTREMITY WITH CONTRAST TECHNIQUE: Multidetector CT imaging of the lower left extremity was performed according to the standard protocol following intravenous contrast administration. CONTRAST:  OMNIPAQUE IOHEXOL 300 MG/ML  SOLN COMPARISON:  Radiograph 11/27/2020 FINDINGS: Bones/Joint/Cartilage Lateral patellar dislocation. Displaced fracture fragment  along the inferior pole patella involving the footprint of the medial bands of the patellar tendon (5/237, 238). Displaced fracture fragment along the lateral patellar margin at the attachment of the medial retinaculum (5/227, 238). Minimally displaced cortical fragment along the apex of the medial femoral trochlear ridge (5/243). Displaced fracture fragment of indeterminate origin along the lateral trochlear facet (5/239). Avulsive appearing fracture fragments at the femoral footprint of the lateral collateral ligament (5/242). Layering lipohemarthrosis. Possible communication between knee joint and prepatellar bursa with layering lipohemarthrosis anterior to the patellar tendon and within lateral recess of the knee. Few punctate foci of gas noted as well, could reflect an open injury given overlying skin thickening and possible laceration. Included bones of the pelvis are intact. Alignment of the hip is maintained. Tibia and fibula appear grossly intact with some motion artifact at the level of the distal third diaphysis. Included portions of the ankle are intact and aligned. Ligaments Suboptimally assessed by CT. PCL is intact.  Thickening and irregularity of the ACL. Avulsion of the lateral collateral ligament with partial retraction. Suspect complete disruption of the medial retinaculum. Lateral retinaculum appears to remain in continuity. Muscles and Tendons Partial tearing and retraction along the medial fibers of the patellar tendon with additional thickening and stranding along the remaining bands. Minimal thickening along the distal quadriceps tendon. Bowing of the iliotibial band superficial to the large lateral likely hemarthrosis and partially retracted lateral collateral ligament. No clear intramuscular hematoma. Soft tissues Extensive circumferential soft tissue swelling of the knee. Some mild anterior soft tissue thickening and possible laceration. Lipohemarthrosis as above as well as few foci of  soft tissue gas within this collection which could suggest an open injury. No radiopaque foreign bodies within the soft tissues. Metallic density overlies the soft tissues of the knee. IMPRESSION: 1. Lateral patellar dislocation with likely avulsion of the medial patellar retinaculum and partial avulsion of the inferior pole insertion of the patellar tendon. 2. Displaced fracture fragment along the lateral femoral trochlea, indeterminate donor site. 3. Fracture along the medial trochlear ridge/apex. 4. Avulsion of the femoral footprint of the lateral collateral ligament partially retracted tendon. 5. Large lipohemarthrosis and suspected communication between the joint space and prepatellar bursa. Punctate foci of gas within this lipohemarthrosis could reflect an open injury. 6. Additional irregularity of the ACL, incompletely assessed. 7. Bowing of the iliotibial band with large effusion in the lateral knee recess. Electronically Signed: By: Kreg Shropshire M.D. On:  11/28/2020 01:48       LOS: 0 days   Shalon Councilman Rito Ehrlich  [facility] Pager on [email or web address]  11/28/2020, 9:02 AM

## 2020-11-28 NOTE — Consult Note (Signed)
ORTHOPAEDIC CONSULTATION  REQUESTING PHYSICIAN: Bonnielee Haff, MD  Chief Complaint: left knee pain  HPI: Carl Rodriguez is a 25 y.o. male with Autism who complains of left knee pain following an unwitnessed seizure event at his group home. He was found on the floor of the bathroom and complaining of left knee pain. Initial imaging showed a lateral patella dislocation. This was reduced in the ED. He says is pain is currently managed. He is unable to add much to the history due to his developmental delay. No family at bedside.  Imaging shows the patella is still laterally dislocated. By the final image, the patella appears to been relocated.  There is a fracture through the distal lateral femoral condyle with a few adjacent fracture fragments. A crescent shaped soft tissue calcification anterior and inferior to the patella is favored to arise from the patella. A calcification is seen medially on the final view and may represent 1 of the fracture fragments arising from the patella but is nonspecific.  Orthopedics was consulted for evaluation.    Chart shows no history of MI, CVA, DVT, PE.  Previously ambulatory without the use of assistive devices.  The patient is living in a group home.    Past Medical History:  Diagnosis Date   Autism    History reviewed. No pertinent surgical history. Social History   Socioeconomic History   Marital status: Single    Spouse name: Not on file   Number of children: Not on file   Years of education: Not on file   Highest education level: Not on file  Occupational History   Not on file  Tobacco Use   Smoking status: Never    Passive exposure: Never   Smokeless tobacco: Never  Vaping Use   Vaping Use: Never used  Substance and Sexual Activity   Alcohol use: No   Drug use: No   Sexual activity: Not on file  Other Topics Concern   Not on file  Social History Narrative   Not on file   Social Determinants of Health   Financial  Resource Strain: Not on file  Food Insecurity: Not on file  Transportation Needs: Not on file  Physical Activity: Not on file  Stress: Not on file  Social Connections: Not on file   Family History  Problem Relation Age of Onset   Hypertension Father    Hyperlipidemia Father    No Known Allergies Prior to Admission medications   Medication Sig Start Date End Date Taking? Authorizing Provider  ARIPiprazole (ABILIFY) 15 MG tablet Take 15 mg by mouth daily. 11/19/20  Yes [provider]  cetirizine (ZYRTEC) 10 MG tablet Take 10 mg by mouth daily. 11/19/20  Yes [provider]  diphenhydrAMINE (BENADRYL) 50 MG capsule Take 50 mg by mouth every 6 (six) hours as needed for allergies.   Yes [provider]  famotidine (PEPCID) 20 MG tablet Take 20 mg by mouth daily. 11/19/20  Yes [provider]  FLUoxetine (PROZAC) 20 MG/5ML solution Take 10 mg by mouth daily. 11/23/20  Yes [provider]  LORazepam (ATIVAN) 1 MG tablet Take 1 mg by mouth daily as needed for anxiety. 10/31/20  Yes [provider]  QUEtiapine (SEROQUEL) 300 MG tablet Take 300 mg by mouth at bedtime. 11/19/20  Yes [provider]  ibuprofen (CHILDRENS IBUPROFEN) 100 MG/5ML suspension Take 30 mLs (600 mg total) by mouth every 6 (six) hours as needed for moderate pain. Patient not  taking: No sig reported 04/14/14   Janit Bern, MD   DG Knee Complete 4 Views Left  Result Date: 11/27/2020 CLINICAL DATA:  Knee injury.  Possible seizure. EXAM: LEFT KNEE - COMPLETE 4+ VIEW COMPARISON:  November 27, 2020 FINDINGS: On the initial view, there appears to be a lateral patellar dislocation, unchanged since earlier tonight. Soft tissue calcifications are seen adjacent to the lateral distal femur, inferior to the patella, unchanged. On the second image, an oblique view, the patella remains dislocated. The calcifications adjacent to the distal aspect of the femur appear to arise from the femur  with donor sites identified. Other calcifications are seen inferior to the patella. On a true lateral view, there is a crescent-shaped calcification anterior and inferior to the patella, likely arising from the patella. On the final view, a frontal view, the patella has been relocated. Fracture fragments are seen adjacent to the distal lateral femoral condyle with an apparent donor site in this region. Soft tissue swelling is seen medially. A fracture fragment is seen medially as well, possibly arising from the patella but nonspecific. IMPRESSION: 1. At the beginning of the study, the patella is still laterally dislocated. By the final image, the patella appears to been relocated. Recommend clinical correlation. 2. There is a fracture through the distal lateral femoral condyle with a few adjacent fracture fragments. A crescent shaped soft tissue calcification anterior and inferior to the patella is favored to arise from the patella. 3. A calcification is seen medially on the final view and may represent 1 of the fracture fragments arising from the patella but is nonspecific. 4. A CT or MRI would better evaluate the source of all the fracture fragments. CT would be better at evaluating bony detail. MRI would be better evaluating tendons and ligaments. Electronically Signed   By: Dorise Bullion III M.D   On: 11/27/2020 23:27   DG Knee Left Port  Result Date: 11/27/2020 CLINICAL DATA:  Knee injury, possible seizure EXAM: PORTABLE LEFT KNEE - 1-2 VIEW COMPARISON:  None. FINDINGS: There appears to be lateral dislocation of the patella on this single view limited study. Possible bone fragment adjacent to the lateral femoral condyle. Recommend full knee series when able. IMPRESSION: Lateral patellar dislocation. Question bone fragment adjacent to the lateral femoral condyle. Recommend full knee series when able. Electronically Signed   By: Rolm Baptise M.D.   On: 11/27/2020 22:07   CT EXTREMITY LOWER LEFT W  CONTRAST  Addendum Date: 11/28/2020   ADDENDUM REPORT: 11/28/2020 02:28 ADDENDUM: Non angiographic imaging technique may detection of subtle vascular injuries. No clear discernible vascular injury to the common, superficial deep femoral arteries popliteal artery at the the the tibioperoneal trunk or vessels in the lower extremity. There is persisting clinical concern, angiography could be obtained. Initial results and addendum were discussed by telephone at the time of interpretation on 11/28/2020 at 2:27 am to provider Ripley Fraise , who verbally acknowledged these results. Electronically Signed   By: Lovena Le M.D.   On: 11/28/2020 02:28   Result Date: 11/28/2020 CLINICAL DATA:  Knee injury during seizure EXAM: CT OF THE LOWER LEFT EXTREMITY WITH CONTRAST TECHNIQUE: Multidetector CT imaging of the lower left extremity was performed according to the standard protocol following intravenous contrast administration. CONTRAST:  196m OMNIPAQUE IOHEXOL 300 MG/ML  SOLN COMPARISON:  Radiograph 11/27/2020 FINDINGS: Bones/Joint/Cartilage Lateral patellar dislocation. Displaced fracture fragment along the inferior pole patella involving the footprint of the medial bands of the patellar tendon (5/237, 238).  Displaced fracture fragment along the lateral patellar margin at the attachment of the medial retinaculum (5/227, 238). Minimally displaced cortical fragment along the apex of the medial femoral trochlear ridge (5/243). Displaced fracture fragment of indeterminate origin along the lateral trochlear facet (5/239). Avulsive appearing fracture fragments at the femoral footprint of the lateral collateral ligament (5/242). Layering lipohemarthrosis. Possible communication between knee joint and prepatellar bursa with layering lipohemarthrosis anterior to the patellar tendon and within lateral recess of the knee. Few punctate foci of gas noted as well, could reflect an open injury given overlying skin thickening and  possible laceration. Included bones of the pelvis are intact. Alignment of the hip is maintained. Tibia and fibula appear grossly intact with some motion artifact at the level of the distal third diaphysis. Included portions of the ankle are intact and aligned. Ligaments Suboptimally assessed by CT. PCL is intact.  Thickening and irregularity of the ACL. Avulsion of the lateral collateral ligament with partial retraction. Suspect complete disruption of the medial retinaculum. Lateral retinaculum appears to remain in continuity. Muscles and Tendons Partial tearing and retraction along the medial fibers of the patellar tendon with additional thickening and stranding along the remaining bands. Minimal thickening along the distal quadriceps tendon. Bowing of the iliotibial band superficial to the large lateral likely hemarthrosis and partially retracted lateral collateral ligament. No clear intramuscular hematoma. Soft tissues Extensive circumferential soft tissue swelling of the knee. Some mild anterior soft tissue thickening and possible laceration. Lipohemarthrosis as above as well as few foci of soft tissue gas within this collection which could suggest an open injury. No radiopaque foreign bodies within the soft tissues. Metallic density overlies the soft tissues of the knee. IMPRESSION: 1. Lateral patellar dislocation with likely avulsion of the medial patellar retinaculum and partial avulsion of the inferior pole insertion of the patellar tendon. 2. Displaced fracture fragment along the lateral femoral trochlea, indeterminate donor site. 3. Fracture along the medial trochlear ridge/apex. 4. Avulsion of the femoral footprint of the lateral collateral ligament partially retracted tendon. 5. Large lipohemarthrosis and suspected communication between the joint space and prepatellar bursa. Punctate foci of gas within this lipohemarthrosis could reflect an open injury. 6. Additional irregularity of the ACL,  incompletely assessed. 7. Bowing of the iliotibial band with large effusion in the lateral knee recess. Electronically Signed: By: Lovena Le M.D. On: 11/28/2020 01:48    Positive ROS: All other systems have been reviewed and were otherwise negative with the exception of those mentioned in the HPI and as above.  Objective: Labs cbc Recent Labs    11/27/20 2113 11/28/20 0534  WBC 10.2 9.6  HGB 14.1 13.6  HCT 45.6 43.8  PLT 280 275    Labs inflam No results for input(s): CRP in the last 72 hours.  Invalid input(s): ESR  Labs coag Recent Labs    11/28/20 0534  INR 1.0    Recent Labs    11/27/20 2113 11/28/20 0534  NA 139 136  K 4.0 4.2  CL 103 104  CO2 28 27  GLUCOSE 101* 104*  BUN 13 10  CREATININE 1.21 0.87  CALCIUM 9.4 8.9    Physical Exam: Vitals:   11/28/20 0303 11/28/20 0700  BP: (!) 145/99 (!) 176/90  Pulse: 96 (!) 114  Resp: 17 18  Temp:    SpO2: 98% 99%   General: Alert, no acute distress. Laying in bed. Shy, quiet Mental status: Alert and Oriented x2 Neurologic: Speech Clear and organized Respiratory: No cyanosis,  no use of accessory musculature Cardiovascular: No pedal edema GI: Abdomen is soft and non-tender, non-distended. Skin: Warm and dry.  Extremities: Warm and well perfused w/o edema Psychiatric: Patient is not competent for consent   MUSCULOSKELETAL:  LLE is in [place]. Did not try to remove. Patient able to move ankle and foot. NVI Other extremities are atraumatic with painless ROM and NVI.  Assessment / Plan: Active Problems:   Patellar dislocation, initial encounter   Going to order a left knee MRI to get a better look at what all is damaged. CT read is unclear. Patient will likely need sedation for the MRI as it was reported he had trouble even making it through the CT scan.  Will be able to better determine what surgical intervention is needed after results from MRI.  Will need to obtain consent from family.     Weightbearing: NWB LLE Insicional and dressing care: n/a Orthopedic device(s):  KI Showering: hold  VTE prophylaxis: Lovenox 56m qd  while in patient Pain control: Tylenol, Oxy, and Morphine PRN Follow - up plan:  TBD Contact information:  TEdmonia LynchMD, MMason Ridge Amb[facility] Surgery Center Dba Gateway Endoscopy CenterPA-C  MBritt BottomPA-C Office [phone]/13/2022 7:45 AM

## 2020-11-28 NOTE — Care Plan (Addendum)
Spoke with Dr. Rito Ehrlich about this patient.  He reports that this is a 25 year old male with history of autism, on multiple medications including Abilify, Prozac and Seroquel.  About a year ago he started having episodes where he he had jerking of upper extremities without loss of consciousness.  He was seen by a neurologist at [facility] and routine EEG was performed which did not show any ictal-interictal abnormalities.  Differentials included tardive dyskinesias versus epilepsy versus nonepileptic events.  Patient was referred to psychiatry for possible tardive dyskinesias.  He presented now because his family heard him fall and had a seizure-like episode lasting for about 10 minutes followed by confusion.  He had left patellar dislocation. Routine EEG has been ordered by the hospitalist but has not been performed yet.  Recommendations -We will await routine EEG read -If normal, ideally patient would benefit from long-term EEG monitoring for characterization of spells.  However, due to autism patient may not be able to cooperate with a long-term study -Patient is already at high risk for epilepsy.  His episodes have persisted and now he had patellar injury.  Therefore, it might be helpful to do a medication trial with antiseizure medication like Depakote which also has mood stabilizing properties -Dr. Rito Ehrlich will discuss with patient's father and if he agrees we will start patient on Depakote 500 mg twice daily.  We will need to monitor weight while on Depakote -If episodes persist, would recommend long-term EEG which could be performed in an outpatient setting (ambulatory EEG) -I will be happy to see the patient if needed.  Gracia Saggese Annabelle Harman

## 2020-11-29 LAB — CBC
HCT: 44.1 % (ref 39.0–52.0)
Hemoglobin: 13.5 g/dL (ref 13.0–17.0)
MCH: 28.2 pg (ref 26.0–34.0)
MCHC: 30.6 g/dL (ref 30.0–36.0)
MCV: 92.1 fL (ref 80.0–100.0)
Platelets: 260 10*3/uL (ref 150–400)
RBC: 4.79 MIL/uL (ref 4.22–5.81)
RDW: 11.9 % (ref 11.5–15.5)
WBC: 6.9 10*3/uL (ref 4.0–10.5)
nRBC: 0 % (ref 0.0–0.2)

## 2020-11-29 LAB — BASIC METABOLIC PANEL
Anion gap: 8 (ref 5–15)
BUN: 12 mg/dL (ref 6–20)
CO2: 28 mmol/L (ref 22–32)
Calcium: 9.3 mg/dL (ref 8.9–10.3)
Chloride: 103 mmol/L (ref 98–111)
Creatinine, Ser: 1.02 mg/dL (ref 0.61–1.24)
GFR, Estimated: >60 mL/min (ref >60)
Glucose, Bld: 121 mg/dL — ABNORMAL HIGH (ref 70–99)
Potassium: 3.7 mmol/L (ref 3.5–5.1)
Sodium: 139 mmol/L (ref 135–145)

## 2020-11-29 MED ORDER — ACETAMINOPHEN 325 MG PO TABS: 650.0000 mg | ORAL_TABLET | Freq: Four times a day (QID) | ORAL | Status: AC | PRN

## 2020-11-29 MED ORDER — DIVALPROEX SODIUM 125 MG PO CSDR: 500.0000 mg | DELAYED_RELEASE_CAPSULE | Freq: Two times a day (BID) | ORAL | 2 refills | Status: AC

## 2020-11-29 MED ORDER — OXYCODONE HCL 5 MG PO TABS: 5.0000 mg | ORAL_TABLET | Freq: Four times a day (QID) | ORAL | 0 refills | Status: AC | PRN

## 2020-11-29 NOTE — Plan of Care (Signed)
  Problem: Nutrition: Goal: Adequate nutrition will be maintained Outcome: Progressing   Problem: Coping: Goal: Level of anxiety will decrease Outcome: Progressing   Problem: Elimination: Goal: Will not experience complications related to urinary retention Outcome: Progressing   Problem: Pain Managment: Goal: General experience of comfort will improve Outcome: Progressing   

## 2020-11-29 NOTE — TOC Transition Note (Signed)
Transition of Care [facility]) - CM/SW Discharge Note  Patient Details  Name: Carl Rodriguez MRN: [id number] Date of Birth: 1996/02/01  Transition of Care [facility]) CM/SW Contact:  Ewing Schlein, LCSW Phone Number: 11/29/2020, 2:59 PM  Clinical Narrative: PT and OT evaluations recommended a rolling walker and 3N1. CSW spoke with patient's father regarding DME recommendations. Father agreeable to DME referral to [employer]. Rolling walker and 3N1 orders placed. CSW made referral to [facility] with [employer]. [employer] to deliver DME to patient's room. TOC signing off.  Final next level of care: Home/Self Care Barriers to Discharge: Barriers Resolved  Patient Goals and CMS Choice CMS Medicare.gov Compare Post Acute Care list provided to:: Patient Represenative (must comment) Choice offered to / list presented to : Parent  Discharge Plan and Services         DME Arranged: 3-N-1, Walker rolling DME Agency: [employer] Date DME Agency Contacted: 11/29/20 Time DME Agency Contacted: [phone] Representative spoke with at DME Agency: Velna Hatchet  Readmission Risk Interventions No flowsheet data found.

## 2020-11-29 NOTE — Evaluation (Signed)
Physical Therapy Evaluation Patient Details Name: Carl Rodriguez MRN: [id number] DOB: [birth year] Today's Date: 11/29/2020   History of Present Illness  25 y.o. male with Autism who complains of left knee pain following an unwitnessed seizure event at his group home. He was found on the floor of the bathroom and complaining of left knee pain. Initial imaging showed a lateral patella dislocation. This was reduced in the ED. He says is pain is currently managed. He is unable to add much to the history due to his developmental delay.  Clinical Impression  Pt admitted with above diagnosis.  Pt currently with functional limitations due to the deficits listed below (see PT Problem List). Pt will benefit from skilled PT to increase their independence and safety with mobility to allow discharge to the venue listed below.   Pt to d/c today, will follow in acute setting if not d/c'd.  Reviewed KI donning and doffing with family and pt. See below for further details. Ready for d/c from PT standpoint with family assist     Follow Up Recommendations Other (comment) (?HHPT, pt to f/u with ortho MD after d/c to determine plan)    Equipment Recommendations  Rolling walker with 5" wheels    Recommendations for Other Services       Precautions / Restrictions Precautions Precautions: Fall Required Braces or Orthoses: Knee Immobilizer - Left Knee Immobilizer - Left: On at all times Restrictions Weight Bearing Restrictions: No LLE Weight Bearing: Weight bearing as tolerated Other Position/Activity Restrictions: WBAT per hospitalist order; and WBAT per hospitalist note per ortho consult; initial note from ortho PA stated NWB      Mobility  Bed Mobility Overal bed mobility: Needs Assistance Bed Mobility: Supine to Sit     Supine to sit: Min guard     General bed mobility comments: pt in recliner    Transfers Overall transfer level: Needs assistance Equipment used: Rolling walker (2  wheeled) Transfers: Sit to/from Stand Sit to Stand: Supervision Stand pivot transfers: Min guard (Pt able to demonstrte hopping to recliner with RW and frequent cues for NWB to LLE.)       General transfer comment: cues for hand placement; moves quickly to stand d/t pain LLE in sitting (unreclined position)  Ambulation/Gait Ambulation/Gait assistance: Min guard Gait Distance (Feet): 50 Feet Assistive device: Rolling walker (2 wheeled) Gait Pattern/deviations: Step-to pattern     General Gait Details: cues for sequence, min/guard for safety  Stairs            Wheelchair Mobility    Modified Rankin (Stroke Patients Only)       Balance Overall balance assessment: Needs assistance Sitting-balance support: No upper extremity supported Sitting balance-Leahy Scale: Good       Standing balance-Leahy Scale: Fair Standing balance comment: reliant on UEs for dynamic tasks                             Pertinent Vitals/Pain Pain Assessment: 0-10 Faces Pain Scale: Hurts a little bit Pain Location: "knee hurts" Pain Descriptors / Indicators: Guarding Pain Intervention(s): Limited activity within patient's tolerance;Monitored during session    Home Living Family/patient expects to be discharged to:: Group home Living Arrangements: Parent Available Help at Discharge: Family Type of Home: House Home Access: Stairs to enter Entrance Stairs-Rails: None Entrance Stairs-Number of Steps: one small step to porch Home Layout: One level Home Equipment: None      Prior Function Level of  Independence: Independent         Comments: Per pt's father, pt needs ocassional assistance with grooming (cues to initiate) but otherwise has been independent without AD/AE/DME; independent amb     Hand Dominance   Dominant Hand: Right    Extremity/Trunk Assessment   Upper Extremity Assessment Upper Extremity Assessment: Defer to OT evaluation;Overall [facility] for tasks  assessed    Lower Extremity Assessment Lower Extremity Assessment: LLE deficits/detail LLE Deficits / Details: ankle WFL LLE: Unable to fully assess due to immobilization    Cervical / Trunk Assessment Cervical / Trunk Assessment: Normal  Communication   Communication: Expressive difficulties;Other (comment) (Pt will answer some questions appropriately, but tends towards echolalia.)  Cognition Arousal/Alertness: Awake/alert Behavior During Therapy: Flat affect Overall Cognitive Status: History of cognitive impairments - at baseline                                 General Comments: pt able to follow commands with incr time and multi-mdoal cues      General Comments      Exercises     Assessment/Plan    PT Assessment Patient needs continued PT services  PT Problem List Decreased strength;Decreased range of motion;Decreased mobility;Decreased balance;Decreased knowledge of use of DME;Pain;Decreased activity tolerance       PT Treatment Interventions DME instruction;Therapeutic activities;Gait training;Functional mobility training;Patient/family education;Therapeutic exercise    PT Goals (Current goals can be found in the Care Plan section)  Acute Rehab PT Goals Patient Stated Goal: home today PT Goal Formulation: With patient/family Time For Goal Achievement: 12/13/20 Potential to Achieve Goals: Good    Frequency Min 6X/week   Barriers to discharge        Co-evaluation               AM-PAC PT "6 Clicks" Mobility  Outcome Measure Help needed turning from your back to your side while in a flat bed without using bedrails?: A Little Help needed moving from lying on your back to sitting on the side of a flat bed without using bedrails?: A Little Help needed moving to and from a bed to a chair (including a wheelchair)?: A Little Help needed standing up from a chair using your arms (e.g., wheelchair or bedside chair)?: A Little Help needed to walk in  hospital room?: A Little Help needed climbing 3-5 steps with a railing? : A Lot 6 Click Score: 17    End of Session Equipment Utilized During Treatment: Gait belt Activity Tolerance: Patient tolerated treatment well Patient left: in chair;with call bell/phone within reach;with chair alarm set;with family/visitor present Nurse Communication: Mobility status PT Visit Diagnosis: Other abnormalities of gait and mobility (R26.89)    Time: 9390-3009 PT Time Calculation (min) (ACUTE ONLY): 20 min   Charges:   PT Evaluation $PT Eval Low Complexity: 1 Low          Jalasia Eskridge, PT  Acute Rehab Dept ([facility]) [phone] Pager [phone]  11/29/2020   [facility] 11/29/2020, 2:52 PM

## 2020-11-29 NOTE — Discharge Instructions (Addendum)
Weight bear as tolerated on left leg as long as wearing the knee immobilizer.

## 2020-11-29 NOTE — Discharge Summary (Signed)
[facility]  Physician Discharge Summary   Patient ID: Carl Rodriguez MRN: [id number] DOB/AGE: [birth year] 25 y.o.  Admit date: 11/27/2020 Discharge date:   11/29/2020   PCP: [facility]  DISCHARGE DIAGNOSES:  Possible seizure Left knee injury with dislocation of patella and multiple other fractures History of autism History of mood disorder  RECOMMENDATIONS FOR OUTPATIENT FOLLOW UP: Follow-up with PCP for seizure disorder and consider referral back to neurology Follow-up with orthopedics for left knee injury    Home Health: Depending on PT evaluation Equipment/Devices: Depending on PT evaluation  CODE STATUS: Full code  DISCHARGE CONDITION: fair  Diet recommendation: As before  INITIAL HISTORY: 25 y.o. male, with history of autism and questionable seizures presented to the ED with a chief complaint of knee pain.  Patient has developmental disability and so unable to provide much information.  According to reports from family there was concern that he had a seizure activity which was not witnessed.  Apparently has had these shaking episodes for a while now.  About a month ago his Cogentin was discontinued.  Patient seems is followed by neurology and by his pediatrician. He was found to have a injuries to his left knee which is thought to require surgical repair.  He was brought in for further management.     Consultants: Orthopedics   Procedures: EEG was done which did not show any epileptiform activity   HOSPITAL COURSE:   Questionable seizure episode Based on chart review it appears the patient has had jerking movements for a while.  It appears that he has been seen by neurology with the [facility].  Last seen by them in February 2021.  Patient had EEG back in February 2021 which did not show any epileptiform activity. Was not started on any antiepileptics at that time.   Based on H&P it looks like patient's Cogentin  was discontinued about a month ago.   Patient underwent CT head which was nonacute.  EEG was done which did not show any epileptiform activity. After discussion with patient's father it appears that these episodes could be concerning for seizure activity.  Subsequently discussed with Dr. Melynda Ripple, neurologist, who also agreed and recommended Depakote.  Patient started on this medication.  Patient will need to follow-up with his PCP.  He may need to be referred back to the neurologist who saw her last year.  Injury to the left knee CT scan of the left lower extremity showed multiple fractures and dislocation of the patella.  Seen by orthopedics.  Plan is for surgery next week.  Orthopedics to follow in their office tomorrow.  Pain control.  PT evaluation.  Weight-bear as tolerated.    History of autism/mood disorder Continue home medications   History of GERD Continue Pepcid  Obesity Estimated body mass index is 39.16 kg/m as calculated from the following:   Height as of 06/11/19:  (1.702 m).   Weight as of 06/11/19: 113.4 kg.   Patient is stable.  Discussed with Dr. Eulah Pont with orthopedics this morning.  Discussed with patient's father.  Okay for discharge after patient has been seen by physical and Occupational Therapy.  PERTINENT LABS:  The results of significant diagnostics from this hospitalization (including imaging, microbiology, ancillary and laboratory) are listed below for reference.    Microbiology: Recent Results (from the past 240 hour(s))  SARS CORONAVIRUS 2 (TAT 6-24 HRS) Nasopharyngeal Nasopharyngeal Swab     Status: None   Collection Time: 11/28/20  2:40 AM   Specimen: Nasopharyngeal Swab  Result Value Ref Range Status   SARS Coronavirus 2 NEGATIVE NEGATIVE Final    Comment: (NOTE) SARS-CoV-2 target nucleic acids are NOT DETECTED.  The SARS-CoV-2 RNA is generally detectable in upper and lower respiratory specimens during the acute phase of infection.  Negative results do not preclude SARS-CoV-2 infection, do not rule out co-infections with other pathogens, and should not be used as the sole basis for treatment or other patient management decisions. Negative results must be combined with clinical observations, patient history, and epidemiological information. The expected result is Negative.  Fact Sheet for Patients: HairSlick.no  Fact Sheet for Healthcare Providers: quierodirigir.com  This test is not yet approved or cleared by the [place] FDA and  has been authorized for detection and/or diagnosis of SARS-CoV-2 by FDA under an Emergency Use Authorization (EUA). This EUA will remain  in effect (meaning this test can be used) for the duration of the COVID-19 declaration under Se ction 564(b)(1) of the Act, 21 U.S.C. section 360bbb-3(b)(1), unless the authorization is terminated or revoked sooner.  Performed at [facility], [address] [place], [place] [address]   Resp Panel by RT-PCR (Flu A&B, Covid) Nasopharyngeal Swab     Status: None   Collection Time: 11/28/20  5:32 AM   Specimen: Nasopharyngeal Swab; Nasopharyngeal(NP) swabs in vial transport medium  Result Value Ref Range Status   SARS Coronavirus 2 by RT PCR NEGATIVE NEGATIVE Final    Comment: (NOTE) SARS-CoV-2 target nucleic acids are NOT DETECTED.  The SARS-CoV-2 RNA is generally detectable in upper respiratory specimens during the acute phase of infection. The lowest concentration of SARS-CoV-2 viral copies this assay can detect is 138 copies/mL. A negative result does not preclude SARS-Cov-2 infection and should not be used as the sole basis for treatment or other patient management decisions. A negative result may occur with  improper specimen collection/handling, submission of specimen other than nasopharyngeal swab, presence of viral mutation(s) within the areas targeted by this assay, and  inadequate number of viral copies(<138 copies/mL). A negative result must be combined with clinical observations, patient history, and epidemiological information. The expected result is Negative.  Fact Sheet for Patients:  BloggerCourse.com  Fact Sheet for Healthcare Providers:  SeriousBroker.it  This test is no t yet approved or cleared by the [place] FDA and  has been authorized for detection and/or diagnosis of SARS-CoV-2 by FDA under an Emergency Use Authorization (EUA). This EUA will remain  in effect (meaning this test can be used) for the duration of the COVID-19 declaration under Section 564(b)(1) of the Act, 21 U.S.C.section 360bbb-3(b)(1), unless the authorization is terminated  or revoked sooner.       Influenza A by PCR NEGATIVE NEGATIVE Final   Influenza B by PCR NEGATIVE NEGATIVE Final    Comment: (NOTE) The Xpert Xpress SARS-CoV-2/FLU/RSV plus assay is intended as an aid in the diagnosis of influenza from Nasopharyngeal swab specimens and should not be used as a sole basis for treatment. Nasal washings and aspirates are unacceptable for Xpert Xpress SARS-CoV-2/FLU/RSV testing.  Fact Sheet for Patients: BloggerCourse.com  Fact Sheet for Healthcare Providers: SeriousBroker.it  This test is not yet approved or cleared by the [place] FDA and has been authorized for detection and/or diagnosis of SARS-CoV-2 by FDA under an Emergency Use Authorization (EUA). This EUA will remain in effect (meaning this test can be used) for the duration of the COVID-19 declaration under Section 564(b)(1) of  the Act, 21 U.S.C. section 360bbb-3(b)(1), unless the authorization is terminated or revoked.  Performed at [facility], [address] [place], [place] [id number]      Labs:  COVID-19 Labs   Lab Results  Component Value Date    SARSCOV2NAA NEGATIVE 11/28/2020   SARSCOV2NAA NEGATIVE 11/28/2020   SARSCOV2NAA Not Detected 03/11/2020      Basic Metabolic Panel: Recent Labs  Lab 11/27/20 2113 11/28/20 0534 11/29/20 0312  NA 139 136 139  K 4.0 4.2 3.7  CL 103 104 103  CO2 28 27 28   GLUCOSE 101* 104* 121*  BUN 13 10 12   CREATININE 1.21 0.87 1.02  CALCIUM 9.4 8.9 9.3  MG 2.3 2.3  --    Liver Function Tests: Recent Labs  Lab 11/27/20 2113 11/28/20 0534  AST 31 29  ALT 19 17  ALKPHOS 60 55  BILITOT 0.9 1.3*  PROT 8.1 7.7  ALBUMIN 4.4 4.2    CBC: Recent Labs  Lab 11/27/20 2113 11/28/20 0534 11/29/20 0312  WBC 10.2 9.6 6.9  HGB 14.1 13.6 13.5  HCT 45.6 43.8 44.1  MCV 90.5 89.6 92.1  PLT 280 275 260      IMAGING STUDIES CT HEAD WO CONTRAST  Result Date: 11/28/2020 CLINICAL DATA:  Seizure. EXAM: CT HEAD WITHOUT CONTRAST TECHNIQUE: Contiguous axial images were obtained from the base of the skull through the vertex without intravenous contrast. COMPARISON:  None. FINDINGS: Brain: There is no evidence of an acute infarct, intracranial hemorrhage, mass, midline shift, or extra-axial fluid collection. The ventricles and sulci are normal. Vascular: No hyperdense vessel. Skull: No fracture or suspicious osseous lesion. Sinuses/Orbits: Trace mucosal thickening in the ethmoid sinuses. Clear mastoid air cells. Unremarkable orbits. Other: None. IMPRESSION: Negative head CT. Electronically Signed   By: Sebastian AcheAllen  Grady M.D.   On: 11/28/2020 12:40   MR KNEE LEFT WO CONTRAST  Result Date: 11/28/2020 CLINICAL DATA:  Knee dislocation suspected (Age >= 1y) EXAM: MRI OF THE LEFT KNEE WITHOUT CONTRAST TECHNIQUE: Multiplanar, multisequence MR imaging of the knee was performed. No intravenous contrast was administered. COMPARISON:  CT 11/28/2020, radiograph 11/27/2020 FINDINGS: The study is severely degraded by motion artifact. MENISCI Medial: Intact. Lateral: Intact. LIGAMENTS Cruciates: ACL and PCL are intact.  Collaterals: Periligamentous edema along the medial collateral ligament, with some internal signal. There is a popliteus tendon avulsion from its insertion on the lateral femoral condyle (coronal PD image 12, with adjacent bony edema. The lateral collateral ligament appears intact, though with periligamentous edema and internal signal proximally. Intact biceps femoris tendon. CARTILAGE Patellofemoral: Medial patellar fracture with likely chondral injury. Medial: No chondral defect. Lateral: Deep fissuring and a focal 9 mm chondral defect along the posterior weight-bearing lateral femoral condyle. JOINT: Large lipohemarthrosis, and communication with the prepatellar space. POPLITEAL FOSSA: No Baker's cyst. EXTENSOR MECHANISM: Lateral patellar dislocation with ruptured medial retinaculum/MPFL. TT-TG distance is approximately 22 mm. BONES: Medial patellar fracture with displaced fracture fragment rotated and inferiorly displaced (sagittal PD image 21, coronal image 30). Popliteus tendon avulsion fracture as described above, with adjacent bony edema in the lateral femoral condyle. Other: Extensive effusion and soft tissue swelling of the knee with likely hematoma formation. IMPRESSION: Severe image degradation by motion artifact. Lateral patellar dislocation. Medial patellar fracture with rotated and inferiorly displaced fracture fragment. Ruptured medial retinaculum/medial patellofemoral ligament. Proximal popliteus tendon avulsion fracture from its insertion on the lateral femoral condyle with minimal retraction. Grade 2 lateral collateral ligament sprain/partial tear. Intact biceps femoris tendon.  Deep fissuring and focal 9 mm chondral defect along the posterior weight-bearing lateral femoral condyle. Grade 2 MCL sprain. Large lipohemarthrosis with communication between the joint space and prepatellar bursa. No evidence of meniscus tear or cruciate ligament injury. Electronically Signed   By: Caprice Renshaw   On:  11/28/2020 12:38   DG Knee Complete 4 Views Left  Result Date: 11/27/2020 CLINICAL DATA:  Knee injury.  Possible seizure. EXAM: LEFT KNEE - COMPLETE 4+ VIEW COMPARISON:  November 27, 2020 FINDINGS: On the initial view, there appears to be a lateral patellar dislocation, unchanged since earlier tonight. Soft tissue calcifications are seen adjacent to the lateral distal femur, inferior to the patella, unchanged. On the second image, an oblique view, the patella remains dislocated. The calcifications adjacent to the distal aspect of the femur appear to arise from the femur with donor sites identified. Other calcifications are seen inferior to the patella. On a true lateral view, there is a crescent-shaped calcification anterior and inferior to the patella, likely arising from the patella. On the final view, a frontal view, the patella has been relocated. Fracture fragments are seen adjacent to the distal lateral femoral condyle with an apparent donor site in this region. Soft tissue swelling is seen medially. A fracture fragment is seen medially as well, possibly arising from the patella but nonspecific. IMPRESSION: 1. At the beginning of the study, the patella is still laterally dislocated. By the final image, the patella appears to been relocated. Recommend clinical correlation. 2. There is a fracture through the distal lateral femoral condyle with a few adjacent fracture fragments. A crescent shaped soft tissue calcification anterior and inferior to the patella is favored to arise from the patella. 3. A calcification is seen medially on the final view and may represent 1 of the fracture fragments arising from the patella but is nonspecific. 4. A CT or MRI would better evaluate the source of all the fracture fragments. CT would be better at evaluating bony detail. MRI would be better evaluating tendons and ligaments. Electronically Signed   By: Gerome Sam III M.D   On: 11/27/2020 23:27   DG Knee Left  Port  Result Date: 11/27/2020 CLINICAL DATA:  Knee injury, possible seizure EXAM: PORTABLE LEFT KNEE - 1-2 VIEW COMPARISON:  None. FINDINGS: There appears to be lateral dislocation of the patella on this single view limited study. Possible bone fragment adjacent to the lateral femoral condyle. Recommend full knee series when able. IMPRESSION: Lateral patellar dislocation. Question bone fragment adjacent to the lateral femoral condyle. Recommend full knee series when able. Electronically Signed   By: Charlett Nose M.D.   On: 11/27/2020 22:07   EEG adult  Result Date: 11/28/2020 Charlsie Quest, MD     11/28/2020  4:48 PM Patient Name: PREM COYKENDALL MRN: [id number] Epilepsy Attending: Charlsie Quest Referring Physician/Provider: Dr Osvaldo Shipper Date: 11/28/2020 Duration: 23.20 minutes Patient history: 25 year old male with history of autism with episodes of jerking and alteration of awareness.  EEG seizures. Level of alertness: Awake AEDs during EEG study: None Technical aspects: This EEG study was done with scalp electrodes positioned according to the 10-20 International system of electrode placement. Electrical activity was acquired at a sampling rate of  and reviewed with a high frequency filter of  and a low frequency filter of . EEG data were recorded continuously and digitally stored. Description: The posterior dominant rhythm consists of 9 Hz activity of moderate voltage (25-35 uV) seen predominantly in posterior head  regions, symmetric and reactive to eye opening and eye closing.  Intermittent generalized 3 to 5 Hz theta-delta slowing was also noted physiologic photic driving was seen during photic stimulation.  Hyperventilation was not performed.   ABNORMALITY -Intermittent slow, generalized IMPRESSION: This study is suggestive of mild diffuse encephalopathy, nonspecific etiology but could be secondary to patient's history of autism. No seizures or epileptiform discharges were seen  throughout the recording. Priyanka Annabelle Harman   CT EXTREMITY LOWER LEFT W CONTRAST  Addendum Date: 11/28/2020   ADDENDUM REPORT: 11/28/2020 02:28 ADDENDUM: Non angiographic imaging technique may detection of subtle vascular injuries. No clear discernible vascular injury to the common, superficial deep femoral arteries popliteal artery at the the the tibioperoneal trunk or vessels in the lower extremity. There is persisting clinical concern, angiography could be obtained. Initial results and addendum were discussed by telephone at the time of interpretation on 11/28/2020 at 2:27 am to provider Zadie Rhine , who verbally acknowledged these results. Electronically Signed   By: Kreg Shropshire M.D.   On: 11/28/2020 02:28   Result Date: 11/28/2020 CLINICAL DATA:  Knee injury during seizure EXAM: CT OF THE LOWER LEFT EXTREMITY WITH CONTRAST TECHNIQUE: Multidetector CT imaging of the lower left extremity was performed according to the standard protocol following intravenous contrast administration. CONTRAST:  OMNIPAQUE IOHEXOL 300 MG/ML  SOLN COMPARISON:  Radiograph 11/27/2020 FINDINGS: Bones/Joint/Cartilage Lateral patellar dislocation. Displaced fracture fragment along the inferior pole patella involving the footprint of the medial bands of the patellar tendon (5/237, 238). Displaced fracture fragment along the lateral patellar margin at the attachment of the medial retinaculum (5/227, 238). Minimally displaced cortical fragment along the apex of the medial femoral trochlear ridge (5/243). Displaced fracture fragment of indeterminate origin along the lateral trochlear facet (5/239). Avulsive appearing fracture fragments at the femoral footprint of the lateral collateral ligament (5/242). Layering lipohemarthrosis. Possible communication between knee joint and prepatellar bursa with layering lipohemarthrosis anterior to the patellar tendon and within lateral recess of the knee. Few punctate foci of gas noted as  well, could reflect an open injury given overlying skin thickening and possible laceration. Included bones of the pelvis are intact. Alignment of the hip is maintained. Tibia and fibula appear grossly intact with some motion artifact at the level of the distal third diaphysis. Included portions of the ankle are intact and aligned. Ligaments Suboptimally assessed by CT. PCL is intact.  Thickening and irregularity of the ACL. Avulsion of the lateral collateral ligament with partial retraction. Suspect complete disruption of the medial retinaculum. Lateral retinaculum appears to remain in continuity. Muscles and Tendons Partial tearing and retraction along the medial fibers of the patellar tendon with additional thickening and stranding along the remaining bands. Minimal thickening along the distal quadriceps tendon. Bowing of the iliotibial band superficial to the large lateral likely hemarthrosis and partially retracted lateral collateral ligament. No clear intramuscular hematoma. Soft tissues Extensive circumferential soft tissue swelling of the knee. Some mild anterior soft tissue thickening and possible laceration. Lipohemarthrosis as above as well as few foci of soft tissue gas within this collection which could suggest an open injury. No radiopaque foreign bodies within the soft tissues. Metallic density overlies the soft tissues of the knee. IMPRESSION: 1. Lateral patellar dislocation with likely avulsion of the medial patellar retinaculum and partial avulsion of the inferior pole insertion of the patellar tendon. 2. Displaced fracture fragment along the lateral femoral trochlea, indeterminate donor site. 3. Fracture along the medial trochlear ridge/apex. 4. Avulsion of the  femoral footprint of the lateral collateral ligament partially retracted tendon. 5. Large lipohemarthrosis and suspected communication between the joint space and prepatellar bursa. Punctate foci of gas within this lipohemarthrosis could  reflect an open injury. 6. Additional irregularity of the ACL, incompletely assessed. 7. Bowing of the iliotibial band with large effusion in the lateral knee recess. Electronically Signed: By: Kreg Shropshire M.D. On: 11/28/2020 01:48    DISCHARGE EXAMINATION: Vitals:   11/29/20 0130 11/29/20 0533 11/29/20 0939 11/29/20 0953  BP: (!) 136/98 (!) 120/59 138/76   Pulse: (!) 110 (!) 109 (!) 112 (!) 112  Resp: 16 16 16    Temp: 98.5 F (36.9 C) 98.3 F (36.8 C) 99 F (37.2 C)   TempSrc: Oral Oral Oral   SpO2: 96% 96% 97%    General appearance: Awake alert.  In no distress.  Distracted Resp: Clear to auscultation bilaterally.  Normal effort Cardio: S1-S2 is normal regular.  No S3-S4.  No rubs murmurs or bruit GI: Abdomen is soft.  Nontender nondistended.  Bowel sounds are present normal.  No masses organomegaly Extremities: Immobilizer over left knee.   DISPOSITION: Home with father  Discharge Instructions     Call MD for:  difficulty breathing, headache or visual disturbances   Complete by: As directed    Call MD for:  extreme fatigue   Complete by: As directed    Call MD for:  persistant dizziness or light-headedness   Complete by: As directed    Call MD for:  persistant nausea and vomiting   Complete by: As directed    Call MD for:  severe uncontrolled pain   Complete by: As directed    Call MD for:  temperature >100.4   Complete by: As directed    Diet general   Complete by: As directed    Discharge instructions   Complete by: As directed    Please be sure to follow-up with Dr. office tomorrow.  Also follow-up with primary care provider.  Patient has been started on Depakote.  One of the side effects of this medication is weight gain so please watch out for that.  Talk to your PCP about referring you back to neurology for outpatient follow-up.   You were cared for by a hospitalist during your hospital stay. If you have any questions about your discharge medications or  the care you received while you were in the hospital after you are discharged, you can call the unit and asked to speak with the hospitalist on call if the hospitalist that took care of you is not available. Once you are discharged, your primary care physician will handle any further medical issues. Please note that NO REFILLS for any discharge medications will be authorized once you are discharged, as it is imperative that you return to your primary care physician (or establish a relationship with a primary care physician if you do not have one) for your aftercare needs so that they can reassess your need for medications and monitor your lab values. If you do not have a primary care physician, you can call [phone] for a physician referral.   Increase activity slowly   Complete by: As directed          Allergies as of 11/29/2020   No Known Allergies      Medication List     STOP taking these medications    ibuprofen 100 MG/5ML suspension Commonly known as: Childrens Ibuprofen       TAKE these  medications    acetaminophen 325 MG tablet Commonly known as: TYLENOL Take 2 tablets (650 mg total) by mouth every 6 (six) hours as needed for mild pain (or Fever >/= 101).   ARIPiprazole 15 MG tablet Commonly known as: ABILIFY Take 15 mg by mouth daily.   cetirizine 10 MG tablet Commonly known as: ZYRTEC Take 10 mg by mouth daily.   diphenhydrAMINE 50 MG capsule Commonly known as: BENADRYL Take 50 mg by mouth every 6 (six) hours as needed for allergies.   divalproex 125 MG capsule Commonly known as: DEPAKOTE SPRINKLE Take 4 capsules (500 mg total) by mouth 2 (two) times daily.   famotidine 20 MG tablet Commonly known as: PEPCID Take 20 mg by mouth daily.   FLUoxetine 20 MG/5ML solution Commonly known as: PROZAC Take 10 mg by mouth daily.   LORazepam 1 MG tablet Commonly known as: ATIVAN Take 1 mg by mouth daily as needed for anxiety.   oxyCODONE 5 MG immediate release  tablet Commonly known as: Oxy IR/ROXICODONE Take 1 tablet (5 mg total) by mouth every 6 (six) hours as needed for severe pain.   QUEtiapine 300 MG tablet Commonly known as: SEROQUEL Take 300 mg by mouth at bedtime.          Follow-up Information     Sheral Apley, MD. Call today.   Specialty: Orthopedic Surgery Contact information: [address] [place] [address] [phone]                 TOTAL DISCHARGE TIME: 35 minutes  Genell Thede Rito Ehrlich  [facility] Pager on [email or web address]  11/29/2020, 12:03 PM

## 2020-11-29 NOTE — Plan of Care (Signed)
  Problem: Education: Goal: Knowledge of General Education information will improve Description: Including pain rating scale, medication(s)/side effects and non-pharmacologic comfort measures Outcome: Adequate for Discharge   Problem: Health Behavior/Discharge Planning: Goal: Ability to manage health-related needs will improve Outcome: Adequate for Discharge   Problem: Clinical Measurements: Goal: Ability to maintain clinical measurements within normal limits will improve Outcome: Adequate for Discharge Goal: Will remain free from infection Outcome: Adequate for Discharge Goal: Diagnostic test results will improve Outcome: Adequate for Discharge Goal: Respiratory complications will improve Outcome: Adequate for Discharge Goal: Cardiovascular complication will be avoided Outcome: Adequate for Discharge   Problem: Activity: Goal: Risk for activity intolerance will decrease Outcome: Adequate for Discharge   Problem: Nutrition: Goal: Adequate nutrition will be maintained Outcome: Adequate for Discharge   Problem: Coping: Goal: Level of anxiety will decrease Outcome: Adequate for Discharge   Problem: Elimination: Goal: Will not experience complications related to bowel motility Outcome: Adequate for Discharge Goal: Will not experience complications related to urinary retention Outcome: Adequate for Discharge   Problem: Pain Managment: Goal: General experience of comfort will improve Outcome: Adequate for Discharge   Problem: Safety: Goal: Ability to remain free from injury will improve Outcome: Adequate for Discharge   Problem: Skin Integrity: Goal: Risk for impaired skin integrity will decrease Outcome: Adequate for Discharge   Problem: Acute Rehab OT Goals (only OT should resolve) Goal: Pt. Will Perform Lower Body Bathing Outcome: Adequate for Discharge Goal: Pt. Will Perform Lower Body Dressing Outcome: Adequate for Discharge Goal: Pt. Will Transfer To  Toilet Outcome: Adequate for Discharge Goal: Pt. Will Perform Toileting-Clothing Manipulation Outcome: Adequate for Discharge Goal: Pt. Will Perform Tub/Shower Transfer Outcome: Adequate for Discharge   Problem: Acute Rehab PT Goals(only PT should resolve) Goal: Pt Will Go Supine/Side To Sit Outcome: Adequate for Discharge Goal: Patient Will Transfer Sit To/From Stand Outcome: Adequate for Discharge Goal: Pt Will Ambulate Outcome: Adequate for Discharge Goal: Pt Will Go Up/Down Stairs Outcome: Adequate for Discharge   Pt discharged with father.  Discharge teaching done. Written information given.

## 2020-11-29 NOTE — Evaluation (Addendum)
Occupational Therapy Evaluation Patient Details Name: Carl Rodriguez MRN: [id number] DOB: [birth year] Today's Date: 11/29/2020    History of Present Illness 25 y.o. male with Autism who complains of left knee pain following an unwitnessed seizure event at his group home. He was found on the floor of the bathroom and complaining of left knee pain. Initial imaging showed a lateral patella dislocation. This was reduced in the ED. He says is pain is currently managed. He is unable to add much to the history due to his developmental delay.   Clinical Impression   Patient is currently requiring assistance with ADLs including minimal assist with toileting,  LE dressing at bed level, minimal assist with seated sponge bathing, and full setup with seated UE dressing and grooming, all of which is below patient's typical baseline of being Independent with all ADLs except grooming.  During this evaluation, patient was limited by NWB to LLE (per Orthopaedic Consult Note) and LT knee pain, which has the potential to impact patient's safety and independence during functional mobility, as well as performance for ADLs. [employer] AM-PAC "6-clicks" Daily Activity Inpatient Short Form score of 19/24 indicates 42.80% ADL impairment this session. Patient lives with his father and a resident in pt's father's Residential home which does have 2 serpated steps to enter.  24/7 supervision and assistance is provided.  Patient demonstrates good rehab potential, and should benefit from continued skilled occupational therapy services while in acute care to maximize safety, independence and quality of life at home.  Continued occupational therapy services in the home is recommended.  ?    Follow Up Recommendations  Home health OT    Equipment Recommendations  3 in 1 bedside commode;Wheelchair (measurements OT);Other (comment) (May need WC if remains NWB after outpatient Orthopedic appointment.)    Recommendations for  Other Services       Precautions / Restrictions Precautions Precautions: Fall Required Braces or Orthoses: Knee Immobilizer - Left Restrictions Weight Bearing Restrictions: Yes LLE Weight Bearing: Non weight bearing Other Position/Activity Restrictions: WBAT per hospitalist order; NWB per Orthopedic PA Note.      Mobility Bed Mobility Overal bed mobility: Needs Assistance Bed Mobility: Supine to Sit     Supine to sit: Min guard     General bed mobility comments: Min guard needed to initiate LLE movement on bed, then pt using UEs to lift and place LE, advancing to EOB with supervision.    Transfers Overall transfer level: Needs assistance Equipment used: Rolling walker (2 wheeled) Transfers: Sit to/from [employer] Sit to Stand: Supervision (Cues for NWB LLE) Stand pivot transfers: Min guard (Pt able to demonstrte hopping to recliner with RW and frequent cues for NWB to LLE.)            Balance Overall balance assessment: Needs assistance   Sitting balance-Leahy Scale: Good       Standing balance-Leahy Scale: Poor Standing balance comment: BUE support needed on RW and balance poor with NWB to LLE.                           ADL either performed or assessed with clinical judgement   ADL Overall ADL's : Needs assistance/impaired Eating/Feeding: Supervision/ safety   Grooming: Set up;Supervision/safety Grooming Details (indicate cue type and reason): Advised for pt to sit for all grooming until cleared for WB by Ortho. Upper Body Bathing: Set up;Sitting;Supervision/ safety   Lower Body Bathing: Bed level;Sitting/lateral leans;Minimal assistance Lower  Body Bathing Details (indicate cue type and reason): sponge Upper Body Dressing : Set up;Supervision/safety;Sitting     Lower Body Dressing Details (indicate cue type and reason): Pt's father educated on bridging and rolling techniques for bed level dressing vs pt standing with NWB and  receiving full assist to don/doff clothing over hips. Discussed clothing recommendations as KI supposed to remain in place at all times.   Toilet Transfer Details (indicate cue type and reason): Pivoted to recliner. Please see Mobility section. Toileting- Clothing Manipulation and Hygiene: Minimal assistance;Sitting/lateral Biomedical scientist Details (indicate cue type and reason): HAnd out and education provided on Tub bench options if pt remains NWB after initial outpatient ORthopedic visit, and if pt is allowed to remove KI for bathing. Pt's father advised to wait to purchse bench until this information is answered in next appointment and agreed. Functional mobility during ADLs: Min guard;Supervision/safety;Rolling walker       Vision   Vision Assessment?: No apparent visual deficits     Perception     Praxis      Pertinent Vitals/Pain Pain Assessment: Faces Faces Pain Scale: Hurts a little bit Pain Location: Pt able to indicate LT knee but pointing and saying "here" Pain Intervention(s): Limited activity within patient's tolerance;Other (comment);Premedicated before session (NWB cues when up. RN notifed and stated pt was premedicated for pain.)     Hand Dominance Right   Extremity/Trunk Assessment Upper Extremity Assessment Upper Extremity Assessment: Overall WFL for tasks assessed   Lower Extremity Assessment Lower Extremity Assessment: Defer to PT evaluation   Cervical / Trunk Assessment Cervical / Trunk Assessment: Normal   Communication Communication Communication: Expressive difficulties;Other (comment) (Pt will answer some questions appropriately, but tends towards echolalia.)   Cognition Arousal/Alertness: Awake/alert Behavior During Therapy: Flat affect Overall Cognitive Status: History of cognitive impairments - at baseline                                 General Comments: Pleasant and cooperative. Able to follow ~75%1-step  instructions well but with poor carry over to recall NWB of LLE and required almost constant verbal cues when standing and pivoting to recliner.   General Comments       Exercises     Shoulder Instructions      Home Living Family/patient expects to be discharged to:: Private residence Living Arrangements: Parent Available Help at Discharge: Family Type of Home: House Home Access: Stairs to enter Secretary/administrator of Steps: 2 separated steps Entrance Stairs-Rails: None Home Layout: One level     Bathroom Shower/Tub: Tub/shower unit;Curtain         Home Equipment: None          Prior Functioning/Environment Level of Independence: Independent        Comments: Per pt's father, pt needs ocassional assistance with grooming (cues to initiate) but otherwise has been independent without AD/AE/DME        OT Problem List: Pain;Decreased knowledge of precautions;Impaired balance (sitting and/or standing);Decreased knowledge of use of DME or AE;Decreased safety awareness      OT Treatment/Interventions: Self-care/ADL training;Therapeutic activities;DME and/or AE instruction;Patient/family education;Balance training    OT Goals(Current goals can be found in the care plan section) Acute Rehab OT Goals Patient Stated Goal: Per Father: For pt to be cleared for weight bearing on LLE as soon as possible. OT Goal Formulation: With family ADL Goals Pt Will Perform Lower Body  Bathing: with supervision;sitting/lateral leans Pt Will Perform Lower Body Dressing: with supervision;sitting/lateral leans;bed level;with set-up Pt Will Transfer to Toilet: with supervision;stand pivot transfer;bedside commode (With NWB to LLE per Orthopedic note) Pt Will Perform Toileting - Clothing Manipulation and hygiene: with adaptive equipment;with caregiver independent in assisting;sitting/lateral leans;with supervision Pt Will Perform Tub/Shower Transfer:  (Pt's father will verbalize understanding to  DME options to support NWB of LLE once pt cleared to remove KI for shower.)  OT Frequency: Min 2X/week   Barriers to D/C: Inaccessible home environment  2 steps to enter home.       Co-evaluation              AM-PAC OT "6 Clicks" Daily Activity     Outcome Measure Help from another person eating meals?: None Help from another person taking care of personal grooming?: A Little Help from another person toileting, which includes using toliet, bedpan, or urinal?: A Little Help from another person bathing (including washing, rinsing, drying)?: A Little Help from another person to put on and taking off regular upper body clothing?: A Little Help from another person to put on and taking off regular lower body clothing?: A Little 6 Click Score: 19   End of Session Equipment Utilized During Treatment: Gait belt;Rolling walker Nurse Communication: Mobility status (Pain)  Activity Tolerance: Patient tolerated treatment well Patient left: in chair;with call bell/phone within reach;with chair alarm set;with family/visitor present  OT Visit Diagnosis: Unsteadiness on feet (R26.81);Pain Pain - Right/Left: Left Pain - part of body: Knee                Time: 1225-1256 OT Time Calculation (min): 31 min Charges:  OT General Charges $OT Visit: 1 Visit OT Evaluation $OT Eval Low Complexity: 1 Low OT Treatments $Self Care/Home Management : 8-22 mins  Victorino Dike, OT Acute Rehab Services Office: [phone] 11/29/2020  Theodoro Clock 11/29/2020, 1:18 PM

## 2020-12-08 ENCOUNTER — Encounter (HOSPITAL_COMMUNITY): Payer: Self-pay | Admitting: Orthopedic Surgery

## 2020-12-08 NOTE — Progress Notes (Addendum)
Finally spoke with patient's father after several attempts to reach him for pre op, Mr. Mackie said he was informed by surgeons office to arrive at [facility] for pre op. He was unable to do pre op over the phone.

## 2020-12-12 NOTE — Anesthesia Preprocedure Evaluation (Addendum)
Anesthesia Evaluation  Patient identified by MRN, date of birth, ID band Patient awake    Reviewed: Allergy & Precautions, NPO status , Patient's Chart, lab work & pertinent test results  Airway Mallampati: II  TM Distance: >3 FB Neck ROM: Full    Dental no notable dental hx.    Pulmonary neg pulmonary ROS,    Pulmonary exam normal breath sounds clear to auscultation       Cardiovascular negative cardio ROS Normal cardiovascular exam Rhythm:Regular Rate:Normal     Neuro/Psych Seizures - (last one 11/30/20 during which he sustained L patellar fx), Poorly Controlled,  Nonverbal, autistic  negative psych ROS   GI/Hepatic Neg liver ROS, GERD  Medicated and Controlled,  Endo/Other  negative endocrine ROS  Renal/GU negative Renal ROS  negative genitourinary   Musculoskeletal L patella fx   Abdominal   Peds  Hematology negative hematology ROS (+) hct 44   Anesthesia Other Findings   Reproductive/Obstetrics negative OB ROS                            Anesthesia Physical Anesthesia Plan  ASA: 3  Anesthesia Plan: General and Regional   Post-op Pain Management: GA combined w/ Regional for post-op pain   Induction: Intravenous  PONV Risk Score and Plan: 2 and Ondansetron, Dexamethasone, Midazolam and Treatment may vary due to age or medical condition  Airway Management Planned: LMA  Additional Equipment: None  Intra-op Plan:   Post-operative Plan: Extubation in OR  Informed Consent: I have reviewed the patients History and Physical, chart, labs and discussed the procedure including the risks, benefits and alternatives for the proposed anesthesia with the patient or authorized representative who has indicated his/her understanding and acceptance.     Dental advisory given  Plan Discussed with: CRNA  Anesthesia Plan Comments:         Anesthesia Quick Evaluation

## 2020-12-12 NOTE — H&P (Signed)
PREOPERATIVE H&P  Chief Complaint: LEFT PATELLA FRACTURE  HPI: Carl Rodriguez is a 25 y.o. male with autism who presents with a diagnosis of LEFT PATELLA FRACTURE. He had a seizure at home on 11/30/20 and complained of left knee pain afterwards. Symptoms are rated as moderate to severe, and have been worsening. This is significantly impairing activities of daily living. MRI of the left knee shows a patella fracture as well as possible MCL and LCL sprains. He has been in a knee immobilizer. He and his father have elected for surgical management.   Past Medical History:  Diagnosis Date   Autism    Seizure (HCC)    No past surgical history on file. Social History   Socioeconomic History   Marital status: Single    Spouse name: Not on file   Number of children: Not on file   Years of education: Not on file   Highest education level: Not on file  Occupational History   Not on file  Tobacco Use   Smoking status: Never    Passive exposure: Never   Smokeless tobacco: Never  Vaping Use   Vaping Use: Never used  Substance and Sexual Activity   Alcohol use: No   Drug use: No   Sexual activity: Not on file  Other Topics Concern   Not on file  Social History Narrative   Not on file   Social Determinants of Health   Financial Resource Strain: Not on file  Food Insecurity: Not on file  Transportation Needs: Not on file  Physical Activity: Not on file  Stress: Not on file  Social Connections: Not on file   Family History  Problem Relation Age of Onset   Hypertension Father    Hyperlipidemia Father    No Known Allergies Prior to Admission medications   Medication Sig Start Date End Date Taking? Authorizing Provider  acetaminophen (TYLENOL) 325 MG tablet Take 2 tablets (650 mg total) by mouth every 6 (six) hours as needed for mild pain (or Fever >/= 101). 11/29/20   Osvaldo Shipper, MD  ARIPiprazole (ABILIFY) 15 MG tablet Take 15 mg by mouth daily. 11/19/20   [provider]  cetirizine (ZYRTEC) 10 MG tablet Take 10 mg by mouth daily. 11/19/20   [provider]  diphenhydrAMINE (BENADRYL) 50 MG capsule Take 50 mg by mouth every 6 (six) hours as needed for allergies.    [provider]  divalproex (DEPAKOTE SPRINKLE) 125 MG capsule Take 4 capsules (500 mg total) by mouth 2 (two) times daily. 11/29/20 02/27/21  Osvaldo Shipper, MD  famotidine (PEPCID) 20 MG tablet Take 20 mg by mouth daily. 11/19/20   [provider]  FLUoxetine (PROZAC) 20 MG/5ML solution Take 10 mg by mouth daily. 11/23/20   [provider]  LORazepam (ATIVAN) 1 MG tablet Take 1 mg by mouth daily as needed for anxiety. 10/31/20   [provider]  oxyCODONE (OXY IR/ROXICODONE) 5 MG immediate release tablet Take 1 tablet (5 mg total) by mouth every 6 (six) hours as needed for severe pain. 11/29/20   Osvaldo Shipper, MD  QUEtiapine (SEROQUEL) 300 MG tablet Take 300 mg by mouth at bedtime. 11/19/20   [provider]     Positive ROS: All other systems have been reviewed and were otherwise negative with the exception of those mentioned in the HPI and as above.  Physical Exam: General: Alert, no acute distress Cardiovascular: No pedal edema Respiratory: No cyanosis, no use of accessory musculature  GI: No organomegaly, abdomen is soft and non-tender Skin: No lesions in the area of chief complaint Neurologic: Sensation intact distally Psychiatric: Patient is competent for consent with normal mood and affect Lymphatic: No axillary or cervical lymphadenopathy  MUSCULOSKELETAL: large left knee effusion, TTP, decreased knee ROM, compartments soft and compressible, NVI  Assessment: LEFT PATELLA FRACTURE  Plan: Plan for Procedure(s): OPEN REDUCTION INTERNAL (ORIF) FIXATION PATELLA, MPFL RECONSTRUCTION, POSSIBLE MCL, POSSIBLE LCL  The risks benefits and alternatives were discussed with the patient including but not limited to the risks of nonoperative  treatment, versus surgical intervention including infection, bleeding, nerve injury,  blood clots, cardiopulmonary complications, morbidity, mortality, among others, and they were willing to proceed.   Weightbearing: NWB LLE Orthopedic devices: KI or bledsoe knee brace Showering: POD 3 Dressing: reinforce as needed Medicines: ASA, Oxy, Tylenol, Baclofen, Zofran  Discharge: home Follow up: 2 weeks    Marzetta Board Office [phone] 12/12/2020 10:23 AM

## 2020-12-13 ENCOUNTER — Ambulatory Visit (HOSPITAL_COMMUNITY): Payer: Medicaid Other | Admitting: Anesthesiology

## 2020-12-13 ENCOUNTER — Ambulatory Visit (HOSPITAL_COMMUNITY)
Admission: RE | Admit: 2020-12-13 | Discharge: 2020-12-13 | Disposition: A | Discharge status: Home / Self Care | Payer: Medicaid Other | Attending: Orthopedic Surgery | Admitting: Orthopedic Surgery

## 2020-12-13 ENCOUNTER — Encounter (HOSPITAL_COMMUNITY): Payer: Self-pay | Admitting: Orthopedic Surgery

## 2020-12-13 ENCOUNTER — Encounter (HOSPITAL_COMMUNITY)
Admission: RE | Disposition: A | Discharge status: Home / Self Care | Payer: Self-pay | Source: Home / Self Care | Attending: Orthopedic Surgery

## 2020-12-13 DIAGNOSIS — S82002A Unspecified fracture of left patella, initial encounter for closed fracture: Secondary | ICD-10-CM | POA: Diagnosis present

## 2020-12-13 DIAGNOSIS — F840 Autistic disorder: Secondary | ICD-10-CM | POA: Diagnosis not present

## 2020-12-13 DIAGNOSIS — X58XXXA Exposure to other specified factors, initial encounter: Secondary | ICD-10-CM | POA: Diagnosis not present

## 2020-12-13 DIAGNOSIS — S76112A Strain of left quadriceps muscle, fascia and tendon, initial encounter: Secondary | ICD-10-CM | POA: Diagnosis not present

## 2020-12-13 DIAGNOSIS — Z79899 Other long term (current) drug therapy: Secondary | ICD-10-CM | POA: Diagnosis not present

## 2020-12-13 DIAGNOSIS — S83006A Unspecified dislocation of unspecified patella, initial encounter: Secondary | ICD-10-CM

## 2020-12-13 DIAGNOSIS — S72422A Displaced fracture of lateral condyle of left femur, initial encounter for closed fracture: Secondary | ICD-10-CM | POA: Insufficient documentation

## 2020-12-13 DIAGNOSIS — S83005A Unspecified dislocation of left patella, initial encounter: Secondary | ICD-10-CM | POA: Diagnosis not present

## 2020-12-13 DIAGNOSIS — Y939 Activity, unspecified: Secondary | ICD-10-CM | POA: Diagnosis not present

## 2020-12-13 DIAGNOSIS — R569 Unspecified convulsions: Secondary | ICD-10-CM | POA: Insufficient documentation

## 2020-12-13 DIAGNOSIS — Z8349 Family history of other endocrine, nutritional and metabolic diseases: Secondary | ICD-10-CM | POA: Insufficient documentation

## 2020-12-13 DIAGNOSIS — Z8249 Family history of ischemic heart disease and other diseases of the circulatory system: Secondary | ICD-10-CM | POA: Insufficient documentation

## 2020-12-13 DIAGNOSIS — M2352 Chronic instability of knee, left knee: Secondary | ICD-10-CM | POA: Diagnosis not present

## 2020-12-13 HISTORY — DX: Unspecified convulsions: R56.9

## 2020-12-13 HISTORY — PX: ORIF PATELLA: SHX5033

## 2020-12-13 SURGERY — OPEN REDUCTION INTERNAL FIXATION (ORIF) PATELLA
Anesthesia: Regional | Site: Knee | Laterality: Left

## 2020-12-13 MED ORDER — FENTANYL CITRATE (PF) 100 MCG/2ML IJ SOLN
50.0000 ug | INTRAMUSCULAR | Status: AC
  Administered 2020-12-13: 100 ug via INTRAVENOUS
  Filled 2020-12-13: qty 2

## 2020-12-13 MED ORDER — HYDROMORPHONE HCL 1 MG/ML IJ SOLN
0.2500 mg | INTRAMUSCULAR | Status: DC | PRN
  Administered 2020-12-13 (×2): 0.25 mg via INTRAVENOUS

## 2020-12-13 MED ORDER — BACLOFEN 10 MG PO TABS: 10.0000 mg | ORAL_TABLET | Freq: Three times a day (TID) | ORAL | 0 refills | Status: AC | PRN

## 2020-12-13 MED ORDER — BUPIVACAINE-EPINEPHRINE (PF) 0.25% -1:200000 IJ SOLN
INTRAMUSCULAR | Status: AC
  Filled 2020-12-13: qty 30

## 2020-12-13 MED ORDER — KETOROLAC TROMETHAMINE 30 MG/ML IJ SOLN
30.0000 mg | Freq: Once | INTRAMUSCULAR | Status: AC | PRN
  Administered 2020-12-13: 30 mg via INTRAVENOUS

## 2020-12-13 MED ORDER — CHLORHEXIDINE GLUCONATE 0.12 % MT SOLN
15.0000 mL | Freq: Once | OROMUCOSAL | Status: AC
  Administered 2020-12-13: 15 mL via OROMUCOSAL

## 2020-12-13 MED ORDER — PHENYLEPHRINE 40 MCG/ML (10ML) SYRINGE FOR IV PUSH (FOR BLOOD PRESSURE SUPPORT)
PREFILLED_SYRINGE | INTRAVENOUS | Status: AC
  Filled 2020-12-13: qty 10

## 2020-12-13 MED ORDER — ESMOLOL HCL 100 MG/10ML IV SOLN
INTRAVENOUS | Status: AC
  Filled 2020-12-13: qty 10

## 2020-12-13 MED ORDER — DEXAMETHASONE SODIUM PHOSPHATE 10 MG/ML IJ SOLN: 8.0000 mg | Freq: Once | INTRAMUSCULAR | Status: DC

## 2020-12-13 MED ORDER — DEXAMETHASONE SODIUM PHOSPHATE 10 MG/ML IJ SOLN
INTRAMUSCULAR | Status: DC | PRN
  Administered 2020-12-13: 10 mg
  Administered 2020-12-13: 8 mg

## 2020-12-13 MED ORDER — OXYCODONE HCL 5 MG PO TABS: 5.0000 mg | ORAL_TABLET | Freq: Once | ORAL | Status: DC | PRN

## 2020-12-13 MED ORDER — LIDOCAINE 2% (20 MG/ML) 5 ML SYRINGE
INTRAMUSCULAR | Status: AC
  Filled 2020-12-13: qty 5

## 2020-12-13 MED ORDER — PROMETHAZINE HCL 25 MG/ML IJ SOLN: 6.2500 mg | INTRAMUSCULAR | Status: DC | PRN

## 2020-12-13 MED ORDER — KETOROLAC TROMETHAMINE 30 MG/ML IJ SOLN
INTRAMUSCULAR | Status: AC
  Filled 2020-12-13: qty 1

## 2020-12-13 MED ORDER — CEFAZOLIN SODIUM-DEXTROSE 2-4 GM/100ML-% IV SOLN
2.0000 g | INTRAVENOUS | Status: AC
  Administered 2020-12-13: 2 g via INTRAVENOUS
  Filled 2020-12-13: qty 100

## 2020-12-13 MED ORDER — MEPERIDINE HCL 50 MG/ML IJ SOLN: 6.2500 mg | INTRAMUSCULAR | Status: DC | PRN

## 2020-12-13 MED ORDER — ONDANSETRON 4 MG PO TBDP: 4.0000 mg | ORAL_TABLET | Freq: Two times a day (BID) | ORAL | 0 refills | Status: AC | PRN

## 2020-12-13 MED ORDER — ACETAMINOPHEN 500 MG PO TABS
1000.0000 mg | ORAL_TABLET | Freq: Once | ORAL | Status: AC
  Administered 2020-12-13: 1000 mg via ORAL
  Filled 2020-12-13: qty 2

## 2020-12-13 MED ORDER — ONDANSETRON HCL 4 MG/2ML IJ SOLN
INTRAMUSCULAR | Status: DC | PRN
  Administered 2020-12-13: 4 mg via INTRAVENOUS

## 2020-12-13 MED ORDER — POVIDONE-IODINE 10 % EX SWAB
2.0000 "application " | Freq: Once | CUTANEOUS | Status: AC
  Administered 2020-12-13: 2 via TOPICAL

## 2020-12-13 MED ORDER — LACTATED RINGERS IV SOLN: INTRAVENOUS | Status: DC

## 2020-12-13 MED ORDER — OXYCODONE HCL 5 MG/5ML PO SOLN
5.0000 mg | Freq: Once | ORAL | Status: DC | PRN
Start: 2020-12-13 — End: 2020-12-13

## 2020-12-13 MED ORDER — DEXMEDETOMIDINE (PRECEDEX) IN NS 20 MCG/5ML (4 MCG/ML) IV SYRINGE
PREFILLED_SYRINGE | INTRAVENOUS | Status: DC | PRN
  Administered 2020-12-13: 4 ug via INTRAVENOUS
  Administered 2020-12-13: 8 ug via INTRAVENOUS
  Administered 2020-12-13 (×2): 4 ug via INTRAVENOUS
  Administered 2020-12-13: 8 ug via INTRAVENOUS
  Administered 2020-12-13 (×2): 4 ug via INTRAVENOUS

## 2020-12-13 MED ORDER — BUPIVACAINE HCL (PF) 0.5 % IJ SOLN
INTRAMUSCULAR | Status: AC
  Filled 2020-12-13: qty 30

## 2020-12-13 MED ORDER — FENTANYL CITRATE (PF) 250 MCG/5ML IJ SOLN
INTRAMUSCULAR | Status: AC
  Filled 2020-12-13: qty 5

## 2020-12-13 MED ORDER — LIDOCAINE 2% (20 MG/ML) 5 ML SYRINGE
INTRAMUSCULAR | Status: DC | PRN
  Administered 2020-12-13: 60 mg via INTRAVENOUS

## 2020-12-13 MED ORDER — DEXMEDETOMIDINE (PRECEDEX) IN NS 20 MCG/5ML (4 MCG/ML) IV SYRINGE
PREFILLED_SYRINGE | INTRAVENOUS | Status: AC
  Filled 2020-12-13: qty 5

## 2020-12-13 MED ORDER — 0.9 % SODIUM CHLORIDE (POUR BTL) OPTIME
TOPICAL | Status: DC | PRN
  Administered 2020-12-13: 1000 mL

## 2020-12-13 MED ORDER — MIDAZOLAM HCL 2 MG/2ML IJ SOLN
1.0000 mg | INTRAMUSCULAR | Status: DC
  Filled 2020-12-13 (×2): qty 2

## 2020-12-13 MED ORDER — PROPOFOL 10 MG/ML IV BOLUS
INTRAVENOUS | Status: DC | PRN
  Administered 2020-12-13: 300 mg via INTRAVENOUS

## 2020-12-13 MED ORDER — HYDROMORPHONE HCL 1 MG/ML IJ SOLN
INTRAMUSCULAR | Status: AC
  Filled 2020-12-13: qty 1

## 2020-12-13 MED ORDER — OXYCODONE HCL 5 MG PO TABS: 5.0000 mg | ORAL_TABLET | Freq: Four times a day (QID) | ORAL | 0 refills | Status: AC | PRN

## 2020-12-13 MED ORDER — DEXAMETHASONE SODIUM PHOSPHATE 10 MG/ML IJ SOLN
INTRAMUSCULAR | Status: AC
  Filled 2020-12-13: qty 1

## 2020-12-13 MED ORDER — PHENYLEPHRINE 40 MCG/ML (10ML) SYRINGE FOR IV PUSH (FOR BLOOD PRESSURE SUPPORT)
PREFILLED_SYRINGE | INTRAVENOUS | Status: DC | PRN
  Administered 2020-12-13 (×8): 80 ug via INTRAVENOUS

## 2020-12-13 MED ORDER — PROPOFOL 10 MG/ML IV BOLUS
INTRAVENOUS | Status: AC
  Filled 2020-12-13: qty 40

## 2020-12-13 MED ORDER — MIDAZOLAM HCL 2 MG/2ML IJ SOLN
1.0000 mg | INTRAMUSCULAR | Status: AC
  Administered 2020-12-13: 2 mg via INTRAVENOUS

## 2020-12-13 MED ORDER — ESMOLOL HCL 100 MG/10ML IV SOLN
INTRAVENOUS | Status: DC | PRN
  Administered 2020-12-13: 10 mg via INTRAVENOUS

## 2020-12-13 MED ORDER — ROPIVACAINE HCL 5 MG/ML IJ SOLN
INTRAMUSCULAR | Status: DC | PRN
  Administered 2020-12-13: 30 mL via PERINEURAL

## 2020-12-13 MED ORDER — FENTANYL CITRATE (PF) 100 MCG/2ML IJ SOLN
INTRAMUSCULAR | Status: DC | PRN
  Administered 2020-12-13 (×3): 50 ug via INTRAVENOUS

## 2020-12-13 MED ORDER — ACETAMINOPHEN 500 MG PO TABS: 1000.0000 mg | ORAL_TABLET | Freq: Once | ORAL | Status: DC

## 2020-12-13 MED ORDER — ASPIRIN EC 81 MG PO TBEC: 81.0000 mg | DELAYED_RELEASE_TABLET | Freq: Two times a day (BID) | ORAL | 0 refills | Status: AC

## 2020-12-13 MED ORDER — ONDANSETRON HCL 4 MG/2ML IJ SOLN
INTRAMUSCULAR | Status: AC
  Filled 2020-12-13: qty 2

## 2020-12-13 SURGICAL SUPPLY — 80 items
ANCH SUT SWLK 19.1X4.75 (Anchor) ×1 IMPLANT
ANCH SUT SWLK 19.1X4.75 CLS (Orthopedic Implant) ×1 IMPLANT
ANCH SUT SWLK 19.1X6.25 CLS (Anchor) ×1 IMPLANT
ANCHOR SUT BIO SW 4.75X19.1 (Anchor) ×2 IMPLANT
ANCHOR SUT SWIVELLOK BIO (Anchor) ×2 IMPLANT
APL PRP STRL LF DISP 70% ISPRP (MISCELLANEOUS) ×1
BAG COUNTER SPONGE SURGICOUNT (BAG) ×2 IMPLANT
BAG SPNG CNTER NS LX DISP (BAG) ×1
BAG SURGICOUNT SPONGE COUNTING (BAG) ×1
BANDAGE ESMARK 6X9 LF (GAUZE/BANDAGES/DRESSINGS) ×1 IMPLANT
BLADE SURG 15 STRL LF DISP TIS (BLADE) ×2 IMPLANT
BLADE SURG 15 STRL SS (BLADE) ×6
BNDG CMPR 9X6 STRL LF SNTH (GAUZE/BANDAGES/DRESSINGS) ×1
BNDG CMPR MED 10X6 ELC LF (GAUZE/BANDAGES/DRESSINGS) ×1
BNDG COHESIVE 4X5 TAN STRL (GAUZE/BANDAGES/DRESSINGS) IMPLANT
BNDG ELASTIC 4X5.8 VLCR STR LF (GAUZE/BANDAGES/DRESSINGS) ×3 IMPLANT
BNDG ELASTIC 6X10 VLCR STRL LF (GAUZE/BANDAGES/DRESSINGS) ×2 IMPLANT
BNDG ESMARK 6X9 LF (GAUZE/BANDAGES/DRESSINGS) ×3
BNDG GAUZE ELAST 4 BULKY (GAUZE/BANDAGES/DRESSINGS) ×2 IMPLANT
BNDG PLASTER X FAST 5X5 WHT LF (CAST SUPPLIES) IMPLANT
BNDG PLSTR 5X5 XFST ST WHT LF (CAST SUPPLIES)
CHLORAPREP W/TINT 26 (MISCELLANEOUS) ×3 IMPLANT
CLOSURE WOUND 1/2 X4 (GAUZE/BANDAGES/DRESSINGS) ×2
CUFF TOURN SGL QUICK 24 (TOURNIQUET CUFF)
CUFF TOURN SGL QUICK 34 (TOURNIQUET CUFF)
CUFF TRNQT CYL 24X4X16.5-23 (TOURNIQUET CUFF) IMPLANT
CUFF TRNQT CYL 34X4.125X (TOURNIQUET CUFF) IMPLANT
DECANTER SPIKE VIAL GLASS SM (MISCELLANEOUS) IMPLANT
DRAPE OEC MINIVIEW 54X84 (DRAPES) ×3 IMPLANT
DRAPE U-SHAPE 47X51 STRL (DRAPES) IMPLANT
DRSG EMULSION OIL 3X3 NADH (GAUZE/BANDAGES/DRESSINGS) IMPLANT
DRSG PAD ABDOMINAL 8X10 ST (GAUZE/BANDAGES/DRESSINGS) ×2 IMPLANT
ELECT REM PT RETURN 15FT ADLT (MISCELLANEOUS) ×3 IMPLANT
GAUZE SPONGE 4X4 12PLY STRL (GAUZE/BANDAGES/DRESSINGS) ×3 IMPLANT
GLOVE SRG 8 PF TXTR STRL LF DI (GLOVE) ×1 IMPLANT
GLOVE SURG ENC MOIS LTX SZ7.5 (GLOVE) ×3 IMPLANT
GLOVE SURG POLYISO LF SZ7.5 (GLOVE) ×3 IMPLANT
GLOVE SURG UNDER POLY LF SZ7.5 (GLOVE) ×3 IMPLANT
GLOVE SURG UNDER POLY LF SZ8 (GLOVE) ×3
GOWN STRL REUS W/TWL LRG LVL3 (GOWN DISPOSABLE) ×3 IMPLANT
GOWN STRL REUS W/TWL XL LVL3 (GOWN DISPOSABLE) ×6 IMPLANT
GRAFT ROPE FROZEN (Tissue) ×3 IMPLANT
GRAFT TISS ROPE SEMITEND 4-5.5 (Tissue) IMPLANT
IMMOBILIZER KNEE 22 UNIV (SOFTGOODS) ×2 IMPLANT
KIT BASIN OR (CUSTOM PROCEDURE TRAY) ×3 IMPLANT
NEEDLE HYPO 22GX1.5 SAFETY (NEEDLE) IMPLANT
NS IRRIG 1000ML POUR BTL (IV SOLUTION) ×3 IMPLANT
PACK IMPLANT BIOCOMPOSITE MPFL (Orthopedic Implant) ×2 IMPLANT
PACK ORTHO EXTREMITY (CUSTOM PROCEDURE TRAY) IMPLANT
PAD CAST 4YDX4 CTTN HI CHSV (CAST SUPPLIES) ×1 IMPLANT
PADDING CAST COTTON 4X4 STRL (CAST SUPPLIES) ×3
PENCIL SMOKE EVACUATOR (MISCELLANEOUS) ×3 IMPLANT
SLEEVE SCD COMPRESS KNEE MED (STOCKING) IMPLANT
SPONGE LAP 4X18 RFD (DISPOSABLE) ×3 IMPLANT
STRIP CLOSURE SKIN 1/2X4 (GAUZE/BANDAGES/DRESSINGS) ×3 IMPLANT
SUCTION FRAZIER HANDLE 10FR (MISCELLANEOUS)
SUCTION TUBE FRAZIER 10FR DISP (MISCELLANEOUS) IMPLANT
SUT ETHILON 3 0 PS 1 (SUTURE) IMPLANT
SUT FIBERWIRE 2-0 18 17.9 3/8 (SUTURE) ×3
SUT MNCRL AB 4-0 PS2 18 (SUTURE) ×4 IMPLANT
SUT MON AB 2-0 CT1 36 (SUTURE) ×3 IMPLANT
SUT MON AB 4-0 PC3 18 (SUTURE) IMPLANT
SUT VIC AB 0 CT1 27 (SUTURE) ×9
SUT VIC AB 0 CT1 27XBRD ANBCTR (SUTURE) ×2 IMPLANT
SUT VIC AB 0 SH 27 (SUTURE) IMPLANT
SUT VIC AB 2-0 CT1 27 (SUTURE) ×3
SUT VIC AB 2-0 CT1 TAPERPNT 27 (SUTURE) IMPLANT
SUT VIC AB 2-0 SH 27 (SUTURE) ×3
SUT VIC AB 2-0 SH 27XBRD (SUTURE) IMPLANT
SUTURE FIBERWR 2-0 18 17.9 3/8 (SUTURE) IMPLANT
SUTURE TAPE 1.3 FIBERLOP 20 ST (SUTURE) IMPLANT
SUTURETAPE 1.3 FIBERLOOP 20 ST (SUTURE) ×9
SWIVELOCK BIO CLOSED 4.75 (Orthopedic Implant) ×2 IMPLANT
SYR BULB EAR ULCER 3OZ GRN STR (SYRINGE) ×3 IMPLANT
TAPE FIBER 2MM 7IN #2 BLUE (SUTURE) ×2 IMPLANT
TOWEL OR NON WOVEN STRL DISP B (DISPOSABLE) ×3 IMPLANT
TUBING CONNECTING 10 (TUBING) IMPLANT
TUBING CONNECTING 10' (TUBING)
UNDERPAD 30X36 HEAVY ABSORB (UNDERPADS AND DIAPERS) ×3 IMPLANT
YANKAUER SUCT BULB TIP NO VENT (SUCTIONS) ×3 IMPLANT

## 2020-12-13 NOTE — Op Note (Signed)
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   ANESTHESIA:   General  PREOPERATIVE INDICATIONS:  Carl Rodriguez is a  24 y.o. male with a diagnosis of patellar dislocation and medial patellofemoral ligament disruption who failed conservative measures and elected for surgical management.    The risks benefits and alternatives were discussed with the patient preoperatively including but not limited to the risks of infection, bleeding, nerve injury, cardiopulmonary complications, the need for revision surgery, stiffness, posttraumatic arthritis, among others, and the patient was willing to proceed.  OPERATIVE IMPLANTS: #2 FiberWire x3 for the deep layer of the medial patellofemoral ligament, and 0 Vicryl x3 for the superficial fascial layer which involved the quadriceps/vastus medialis tendon.  OPERATIVE FINDINGS:  There was a visible defect where the MPFL was avulsed from the medial patella, chondral injury, inf/medial patella fx. Laxity to valgus stress.    OPERATIVE PROCEDURE:  I performed an anterior incision after inflating his tourniquet  I dissected to his medial parapatellar retinaculum and incised this.  Patella was very unstable he had a medial based fracture with avulsion of his MPFL as well as a chondral injury.  Unfortunately his chondral injury had no bony back on it and was not able to be repaired this was off of the nose I performed a effective chondroplasty here.  Next I performed a lateral release of the  lateral retinaculum to aid in medialization of the patella.  Next I tested his MCL it was lax to valgus stressing I made a small incision over his pes bursa and placed Arthrex anchor with fiber tape I then tunneled this through the appropriate layer to just adjacent to the medial epicondyle I identified the isometric point and placed a second anchor here creating an internal brace repair of his MCL.   After performing the below mentioned MPFL reconstruction I was able to repair soft tissue effectively reducing the medial patella to its place it was too small to hold any hardware.  This was effectively the ORIF of his medial patella   I then elevated the fascial layer of the quadriceps investment, and mobilized this. I then made an incision through the deep capsular layer including the MPL, and I performed a pants over vest imbrication using #2 FiberWire of the deepest layer. This provided excellent restoration of stability and tracking of the patella. I then repaired the layer in involving the vastus medialis, using a pants over vest repair with 0 Vicryl. This provided excellent augmentation to the repair. I then inserted the scope through the medial portal again, and confirm that I had appropriate translation and correction of patellar tilt.  After irrigating was copiously and repaired the tissue with Vicryl and routine closure for the skin with Steri-Strips and sterile gauze. He received a preoperative block. Sterile dressings were applied. The tourniquet was released.  He was awakened and returned to the PACU in stable and satisfactory condition. There were no complications.  POST-OP PLAN: Knee immobilizer full time, WBAT in immobilizer, DVT px: Ambulation, foot pumps and aspirin for DVT prophylaxis

## 2020-12-13 NOTE — Discharge Instructions (Signed)

## 2020-12-13 NOTE — Transfer of Care (Signed)
Immediate Anesthesia Transfer of Care Note  Patient: Carl Rodriguez  Procedure(s) Performed: OPEN REDUCTION INTERNAL (ORIF) FIXATION PATELLA, MPFL RECONSTRUCTION, POSSIBLE MCL, POSSIBLE LCL (Left: Knee)  Patient Location: PACU  Anesthesia Type:General and Regional  Level of Consciousness: drowsy  Airway & Oxygen Therapy: Patient Spontanous Breathing and Patient connected to face mask oxygen  Post-op Assessment: Report given to RN and Post -op Vital signs reviewed and stable  Post vital signs: Reviewed and stable  Last Vitals:  Vitals Value Taken Time  BP 174/97 12/13/20 1300  Temp    Pulse 103 12/13/20 1308  Resp 20 12/13/20 1308  SpO2 98 % 12/13/20 1308  Vitals shown include unvalidated device data.  Last Pain:  Vitals:   12/13/20 0958  PainSc: 0-No pain         Complications: No notable events documented.

## 2020-12-13 NOTE — Anesthesia Postprocedure Evaluation (Signed)
Anesthesia Post Note  Patient: ERBY SANDERSON  Procedure(s) Performed: OPEN REDUCTION INTERNAL (ORIF) FIXATION PATELLA, MPFL RECONSTRUCTION, POSSIBLE MCL, POSSIBLE LCL (Left: Knee)     Patient location during evaluation: PACU Anesthesia Type: Regional and General Level of consciousness: awake and alert, oriented and patient cooperative Pain management: pain level controlled Vital Signs Assessment: post-procedure vital signs reviewed and stable Respiratory status: spontaneous breathing, nonlabored ventilation and respiratory function stable Cardiovascular status: blood pressure returned to baseline and stable Postop Assessment: no apparent nausea or vomiting Anesthetic complications: no   No notable events documented.  Last Vitals:  Vitals:   12/13/20 1345 12/13/20 1400  BP: (!) 162/99 (!) 151/96  Pulse: (!) 103 (!) 106  Resp: 19 (!) 22  Temp:    SpO2: 100% 99%    Last Pain:  Vitals:   12/13/20 1400  PainSc: Asleep                 Lannie Fields

## 2020-12-13 NOTE — Progress Notes (Signed)
Assisted Dr. Beth Finucane with left, ultrasound guided, adductor canal block. Side rails up, monitors on throughout procedure. See vital signs in flow sheet. Tolerated Procedure well. ° °

## 2020-12-13 NOTE — Anesthesia Procedure Notes (Signed)
Procedure Name: LMA Insertion Date/Time: 12/13/2020 10:46 AM Performed by: Marny Lowenstein, CRNA Pre-anesthesia Checklist: Patient identified, Emergency Drugs available, Suction available and Patient being monitored Patient Re-evaluated:Patient Re-evaluated prior to induction Oxygen Delivery Method: Circle system utilized Preoxygenation: Pre-oxygenation with 100% oxygen Induction Type: IV induction Ventilation: Mask ventilation without difficulty LMA: LMA inserted LMA Size: 5.0 Laser Tube: Cuffed inflated with minimal occlusive pressure - saline Number of attempts: 1 Placement Confirmation: positive ETCO2 and breath sounds checked- equal and bilateral Tube secured with: Tape Dental Injury: Teeth and Oropharynx as per pre-operative assessment

## 2020-12-13 NOTE — Interval H&P Note (Signed)
History and Physical Interval Note:  12/13/2020 6:55 AM  Carl Rodriguez  has presented today for surgery, with the diagnosis of LEFT PATELLA FRACTURE.  The various methods of treatment have been discussed with the patient and family. After consideration of risks, benefits and other options for treatment, the patient has consented to  Procedure(s): OPEN REDUCTION INTERNAL (ORIF) FIXATION PATELLA, MPFL RECONSTRUCTION, POSSIBLE MCL, POSSIBLE LCL (Left) as a surgical intervention.  The patient's history has been reviewed, patient examined, no change in status, stable for surgery.  I have reviewed the patient's chart and labs.  Questions were answered to the patient's satisfaction.     Sheral Apley

## 2020-12-13 NOTE — Anesthesia Procedure Notes (Signed)
Anesthesia Regional Block: Adductor canal block   Pre-Anesthetic Checklist: , timeout performed,  Correct Patient, Correct Site, Correct Laterality,  Correct Procedure, Correct Position, site marked,  Risks and benefits discussed,  Surgical consent,  Pre-op evaluation,  At surgeon's request and post-op pain management  Laterality: Left  Prep: Maximum Sterile Barrier Precautions used, chloraprep       Needles:  Injection technique: Single-shot  Needle Type: Echogenic Stimulator Needle     Needle Length: 9cm  Needle Gauge: 22     Additional Needles:   Procedures:,,,, ultrasound used (permanent image in chart),,    Narrative:  Start time: 12/13/2020 9:45 AM End time: 12/13/2020 9:50 AM Injection made incrementally with aspirations every 5 mL.  Performed by: Personally  Anesthesiologist: Lannie Fields, DO  Additional Notes: Monitors applied. No increased pain on injection. No increased resistance to injection. Injection made in 5cc increments. Good needle visualization. Patient tolerated procedure well.

## 2020-12-14 ENCOUNTER — Encounter (HOSPITAL_COMMUNITY): Payer: Self-pay | Admitting: Orthopedic Surgery

## 2020-12-21 ENCOUNTER — Encounter (HOSPITAL_COMMUNITY): Payer: Self-pay | Admitting: Orthopedic Surgery

## 2022-08-29 IMAGING — MR MR KNEE*L* W/O CM
7 series · 40 of 40 positions shown · non-contrast
Comparison: CT 11/28/2020, radiograph 11/27/2020

CLINICAL DATA: Knee dislocation suspected (Age >= 1y)

EXAM:
MRI OF THE LEFT KNEE WITHOUT CONTRAST
TECHNIQUE: Multiplanar, multisequence MR imaging of the knee was performed. No
intravenous contrast was administered.

[Series 6: T2 fat-sat · axial · left · 4.0mm · 0.50mm/px · z∈[-145,+22]mm · 7 of 39 slices shown (1 of 4)]
[im 1/39]
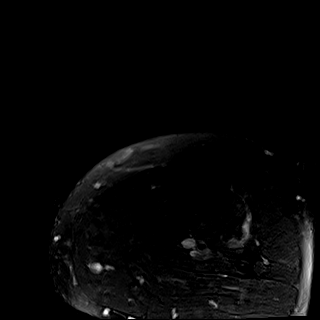
[im 7/39]
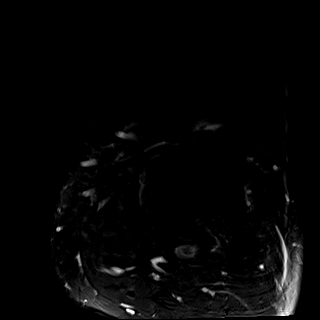
[im 13/39]
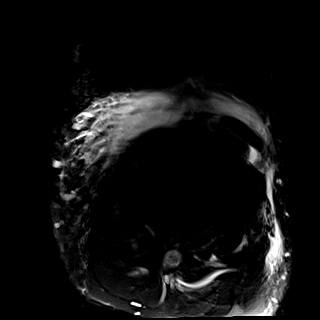
[im 20/39]
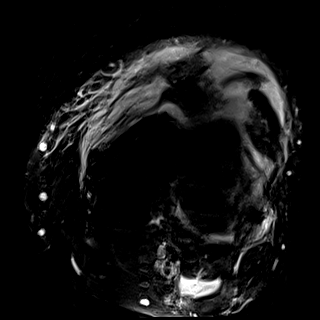
[im 26/39]
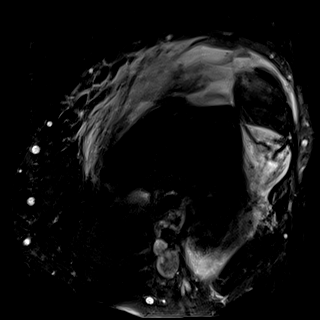
[im 32/39]
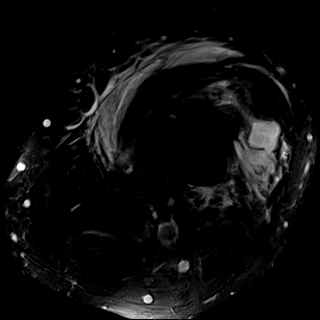
[im 39/39]
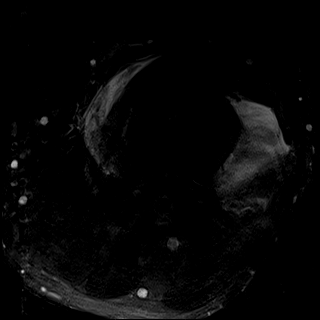

[Series 9: T2 fat-sat · sagittal · left · 4.0mm · 0.59mm/px · 5 of 28 slices shown (2 of 4)]
[im 1/28]
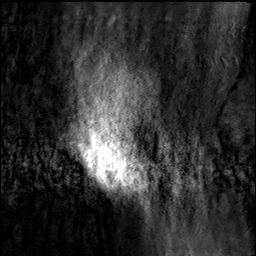
[im 7/28]
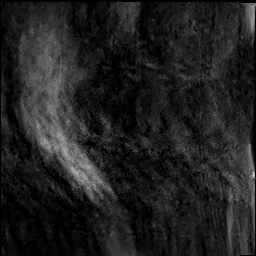
[im 14/28]
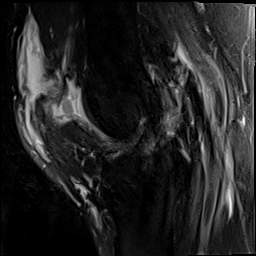
[im 21/28]
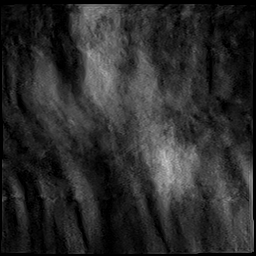
[im 28/28]
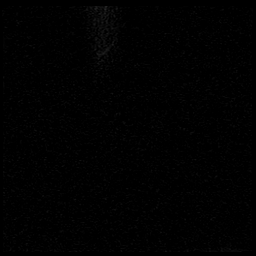

[Series 13: PD fat-sat · sagittal · left · 4.0mm · 0.47mm/px · 5 of 29 slices shown (1 of 2)]
[im 1/29]
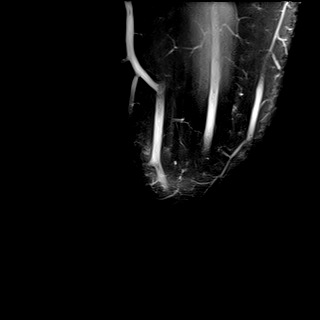
[im 8/29]
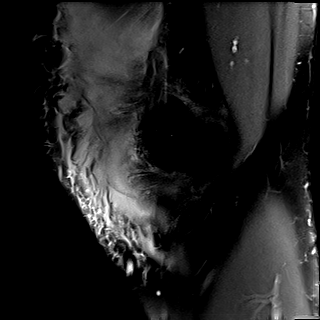
[im 15/29]
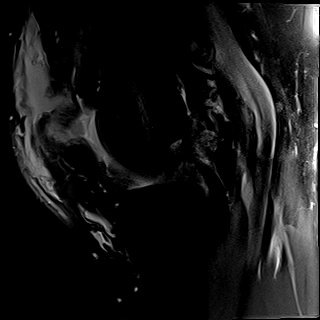
[im 22/29]
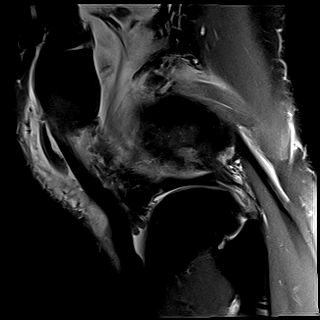
[im 29/29]
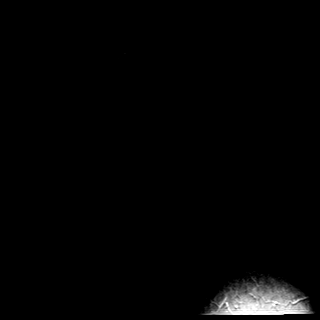

[Series 14: PD fat-sat · coronal · left · 3.0mm · 0.47mm/px · 6 of 36 slices shown (2 of 2)]
[im 1/36]
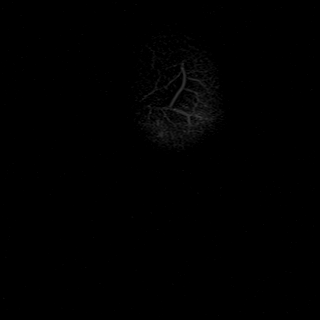
[im 8/36]
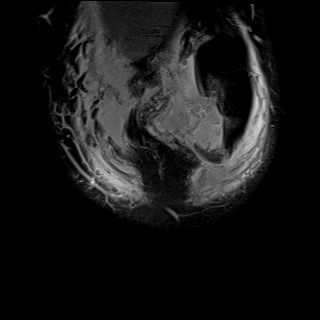
[im 15/36]
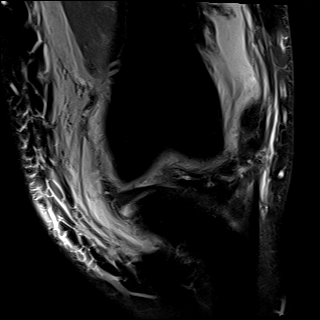
[im 22/36]
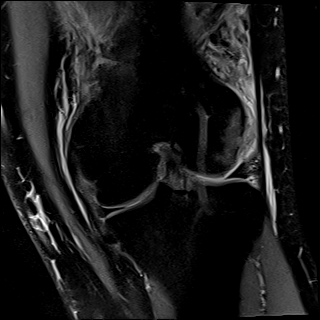
[im 29/36]
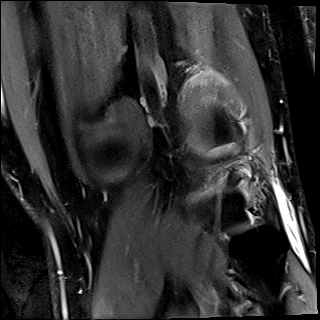
[im 36/36]
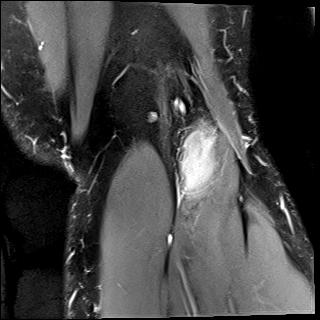

[Series 15: T2 fat-sat · axial · left · 4.0mm · 0.62mm/px · z∈[-146,+21]mm · 7 of 39 slices shown (3 of 4)]
[im 1/39]
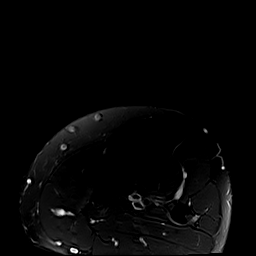
[im 7/39]
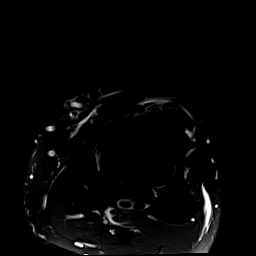
[im 13/39]
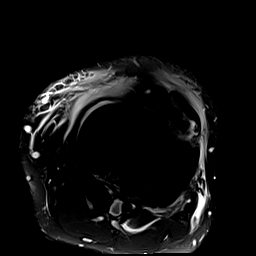
[im 20/39]
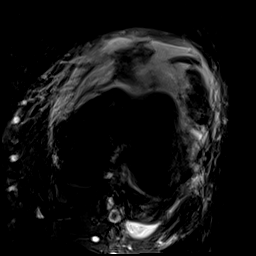
[im 26/39]
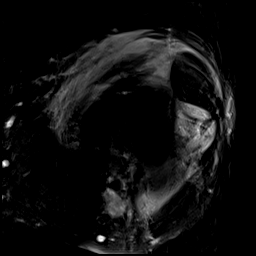
[im 32/39]
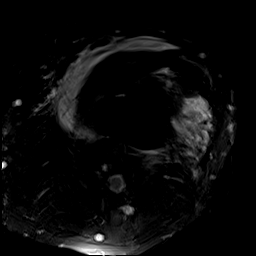
[im 39/39]
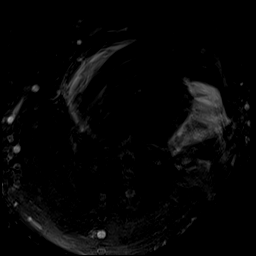

[Series 16: T1 · coronal · left · 4.0mm · 0.47mm/px · 5 of 30 slices shown]
[im 1/30]
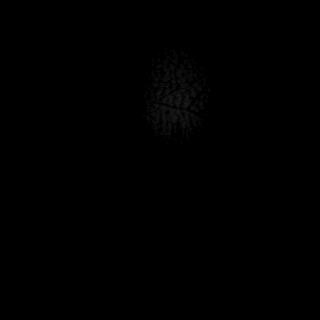
[im 8/30]
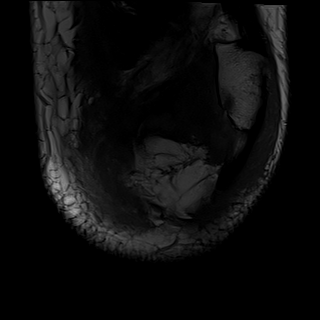
[im 15/30]
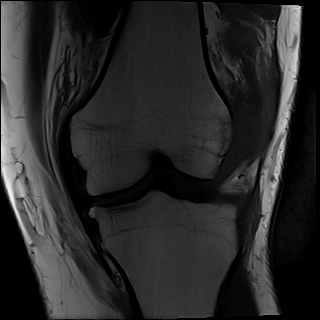
[im 22/30]
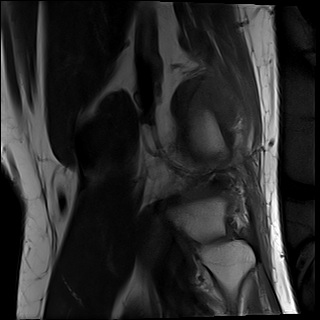
[im 30/30]
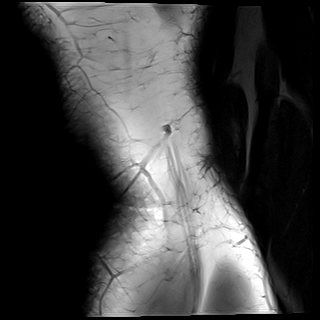

[Series 17: T2 fat-sat · sagittal · left · 4.0mm · 0.59mm/px · 5 of 28 slices shown (4 of 4)]
[im 1/28]
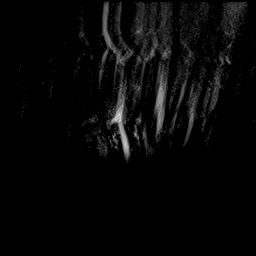
[im 7/28]
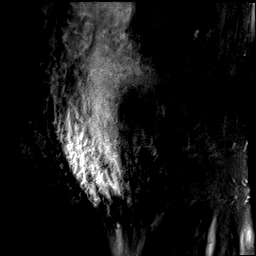
[im 14/28]
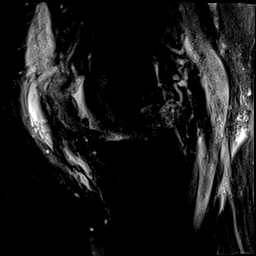
[im 21/28]
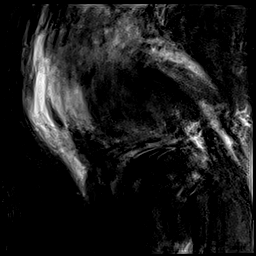
[im 28/28]
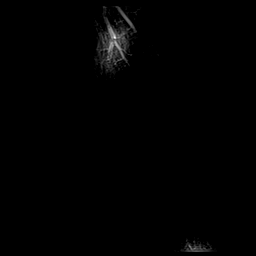

[40 of 40 positions shown; findings below may reference images not displayed]

FINDINGS: The study is severely degraded by motion artifact.

MENISCI

Medial: Intact.

Lateral: Intact.

LIGAMENTS

Cruciates: ACL and PCL are intact.

Collaterals: Periligamentous edema along the medial collateral
ligament, with some internal signal. There is a popliteus tendon
avulsion from its insertion on the lateral femoral condyle (coronal
PD image 12, with adjacent bony edema. The lateral collateral
ligament appears intact, though with periligamentous edema and
internal signal proximally. Intact biceps femoris tendon.

CARTILAGE

Patellofemoral: Medial patellar fracture with likely chondral
injury.

Medial: No chondral defect.

Lateral: Deep fissuring and a focal 9 mm chondral defect along the
posterior weight-bearing lateral femoral condyle.

JOINT: Large lipohemarthrosis, and communication with the
prepatellar space.

POPLITEAL FOSSA: No Baker's cyst.

EXTENSOR MECHANISM: Lateral patellar dislocation with ruptured
medial retinaculum/MPFL. TT-TG distance is approximately 22 mm.

BONES: Medial patellar fracture with displaced fracture fragment
rotated and inferiorly displaced (sagittal PD image 21, coronal
image 30). Popliteus tendon avulsion fracture as described above,
with adjacent bony edema in the lateral femoral condyle.

Other: Extensive effusion and soft tissue swelling of the knee with
likely hematoma formation.
IMPRESSION: Severe image degradation by motion artifact.

Lateral patellar dislocation. Medial patellar fracture with rotated
and inferiorly displaced fracture fragment. Ruptured medial
retinaculum/medial patellofemoral ligament.

Proximal popliteus tendon avulsion fracture from its insertion on
the lateral femoral condyle with minimal retraction. Grade 2 lateral
collateral ligament sprain/partial tear. Intact biceps femoris
tendon.

Deep fissuring and focal 9 mm chondral defect along the posterior
weight-bearing lateral femoral condyle.

Grade 2 MCL sprain.

Large lipohemarthrosis with communication between the joint space
and prepatellar bursa.

No evidence of meniscus tear or cruciate ligament injury.
